# Patient Record
Sex: Female | Born: 1950 | Race: White | Hispanic: No | Marital: Single | Smoking: Current every day smoker
Health system: Southern US, Community
[De-identification: ages and names within clinical notes are randomized; demographics above are authoritative.]

## PROBLEM LIST (undated history)

## (undated) DIAGNOSIS — F419 Anxiety disorder, unspecified: Secondary | ICD-10-CM

## (undated) DIAGNOSIS — I10 Essential (primary) hypertension: Secondary | ICD-10-CM

## (undated) DIAGNOSIS — M5030 Other cervical disc degeneration, unspecified cervical region: Secondary | ICD-10-CM

## (undated) DIAGNOSIS — B159 Hepatitis A without hepatic coma: Secondary | ICD-10-CM

## (undated) HISTORY — PX: BLADDER SURGERY: SHX569

---

## 2014-04-23 DIAGNOSIS — M76899 Other specified enthesopathies of unspecified lower limb, excluding foot: Secondary | ICD-10-CM | POA: Diagnosis not present

## 2014-04-23 DIAGNOSIS — Z79899 Other long term (current) drug therapy: Secondary | ICD-10-CM | POA: Diagnosis not present

## 2014-04-23 DIAGNOSIS — Z131 Encounter for screening for diabetes mellitus: Secondary | ICD-10-CM | POA: Diagnosis not present

## 2014-04-23 DIAGNOSIS — I10 Essential (primary) hypertension: Secondary | ICD-10-CM | POA: Diagnosis not present

## 2014-07-23 DIAGNOSIS — I10 Essential (primary) hypertension: Secondary | ICD-10-CM | POA: Diagnosis not present

## 2014-10-22 DIAGNOSIS — M25561 Pain in right knee: Secondary | ICD-10-CM | POA: Diagnosis not present

## 2014-10-22 DIAGNOSIS — Z0000 Encounter for general adult medical examination without abnormal findings: Secondary | ICD-10-CM | POA: Diagnosis not present

## 2014-10-22 DIAGNOSIS — I10 Essential (primary) hypertension: Secondary | ICD-10-CM | POA: Diagnosis not present

## 2014-10-22 DIAGNOSIS — Z131 Encounter for screening for diabetes mellitus: Secondary | ICD-10-CM | POA: Diagnosis not present

## 2014-10-22 DIAGNOSIS — M25562 Pain in left knee: Secondary | ICD-10-CM | POA: Diagnosis not present

## 2014-11-06 DIAGNOSIS — K265 Chronic or unspecified duodenal ulcer with perforation: Secondary | ICD-10-CM | POA: Diagnosis not present

## 2014-11-06 DIAGNOSIS — J441 Chronic obstructive pulmonary disease with (acute) exacerbation: Secondary | ICD-10-CM | POA: Diagnosis present

## 2014-11-06 DIAGNOSIS — K668 Other specified disorders of peritoneum: Secondary | ICD-10-CM | POA: Diagnosis not present

## 2014-11-06 DIAGNOSIS — Z8249 Family history of ischemic heart disease and other diseases of the circulatory system: Secondary | ICD-10-CM | POA: Diagnosis not present

## 2014-11-06 DIAGNOSIS — Z2821 Immunization not carried out because of patient refusal: Secondary | ICD-10-CM | POA: Diagnosis not present

## 2014-11-06 DIAGNOSIS — N390 Urinary tract infection, site not specified: Secondary | ICD-10-CM | POA: Diagnosis not present

## 2014-11-06 DIAGNOSIS — M1711 Unilateral primary osteoarthritis, right knee: Secondary | ICD-10-CM | POA: Diagnosis not present

## 2014-11-06 DIAGNOSIS — R0602 Shortness of breath: Secondary | ICD-10-CM | POA: Diagnosis not present

## 2014-11-06 DIAGNOSIS — N179 Acute kidney failure, unspecified: Secondary | ICD-10-CM | POA: Diagnosis not present

## 2014-11-06 DIAGNOSIS — E876 Hypokalemia: Secondary | ICD-10-CM | POA: Diagnosis present

## 2014-11-06 DIAGNOSIS — K598 Other specified functional intestinal disorders: Secondary | ICD-10-CM | POA: Diagnosis not present

## 2014-11-06 DIAGNOSIS — J9811 Atelectasis: Secondary | ICD-10-CM | POA: Diagnosis not present

## 2014-11-06 DIAGNOSIS — F17200 Nicotine dependence, unspecified, uncomplicated: Secondary | ICD-10-CM | POA: Diagnosis present

## 2014-11-06 DIAGNOSIS — N2889 Other specified disorders of kidney and ureter: Secondary | ICD-10-CM | POA: Diagnosis present

## 2014-11-06 DIAGNOSIS — D270 Benign neoplasm of right ovary: Secondary | ICD-10-CM | POA: Diagnosis not present

## 2014-11-06 DIAGNOSIS — K560 Paralytic ileus: Secondary | ICD-10-CM | POA: Diagnosis not present

## 2014-11-06 DIAGNOSIS — A419 Sepsis, unspecified organism: Secondary | ICD-10-CM | POA: Diagnosis present

## 2014-11-06 DIAGNOSIS — I10 Essential (primary) hypertension: Secondary | ICD-10-CM | POA: Diagnosis present

## 2014-11-06 DIAGNOSIS — R918 Other nonspecific abnormal finding of lung field: Secondary | ICD-10-CM | POA: Diagnosis not present

## 2014-11-06 DIAGNOSIS — R6521 Severe sepsis with septic shock: Secondary | ICD-10-CM | POA: Diagnosis not present

## 2014-11-06 DIAGNOSIS — K631 Perforation of intestine (nontraumatic): Secondary | ICD-10-CM | POA: Diagnosis not present

## 2014-11-06 DIAGNOSIS — Z8261 Family history of arthritis: Secondary | ICD-10-CM | POA: Diagnosis not present

## 2014-11-06 DIAGNOSIS — M11261 Other chondrocalcinosis, right knee: Secondary | ICD-10-CM | POA: Diagnosis not present

## 2014-11-06 DIAGNOSIS — K219 Gastro-esophageal reflux disease without esophagitis: Secondary | ICD-10-CM | POA: Diagnosis present

## 2014-11-06 DIAGNOSIS — E279 Disorder of adrenal gland, unspecified: Secondary | ICD-10-CM | POA: Diagnosis present

## 2014-11-06 DIAGNOSIS — R1013 Epigastric pain: Secondary | ICD-10-CM | POA: Diagnosis not present

## 2014-11-06 DIAGNOSIS — Z780 Asymptomatic menopausal state: Secondary | ICD-10-CM | POA: Diagnosis not present

## 2014-11-06 DIAGNOSIS — Z880 Allergy status to penicillin: Secondary | ICD-10-CM | POA: Diagnosis not present

## 2014-11-06 DIAGNOSIS — Z79899 Other long term (current) drug therapy: Secondary | ICD-10-CM | POA: Diagnosis not present

## 2014-11-06 DIAGNOSIS — K6389 Other specified diseases of intestine: Secondary | ICD-10-CM | POA: Diagnosis not present

## 2014-11-06 DIAGNOSIS — Z888 Allergy status to other drugs, medicaments and biological substances status: Secondary | ICD-10-CM | POA: Diagnosis not present

## 2014-11-06 DIAGNOSIS — M25461 Effusion, right knee: Secondary | ICD-10-CM | POA: Diagnosis not present

## 2014-11-06 DIAGNOSIS — J449 Chronic obstructive pulmonary disease, unspecified: Secondary | ICD-10-CM | POA: Diagnosis not present

## 2014-11-06 DIAGNOSIS — M25561 Pain in right knee: Secondary | ICD-10-CM | POA: Diagnosis present

## 2014-11-13 DIAGNOSIS — A048 Other specified bacterial intestinal infections: Secondary | ICD-10-CM | POA: Diagnosis present

## 2014-11-13 DIAGNOSIS — J441 Chronic obstructive pulmonary disease with (acute) exacerbation: Secondary | ICD-10-CM | POA: Diagnosis not present

## 2014-11-13 DIAGNOSIS — R109 Unspecified abdominal pain: Secondary | ICD-10-CM | POA: Diagnosis not present

## 2014-11-13 DIAGNOSIS — N390 Urinary tract infection, site not specified: Secondary | ICD-10-CM | POA: Diagnosis not present

## 2014-11-13 DIAGNOSIS — L89151 Pressure ulcer of sacral region, stage 1: Secondary | ICD-10-CM | POA: Diagnosis present

## 2014-11-13 DIAGNOSIS — M25461 Effusion, right knee: Secondary | ICD-10-CM | POA: Diagnosis not present

## 2014-11-13 DIAGNOSIS — K265 Chronic or unspecified duodenal ulcer with perforation: Secondary | ICD-10-CM | POA: Diagnosis present

## 2014-11-13 DIAGNOSIS — J449 Chronic obstructive pulmonary disease, unspecified: Secondary | ICD-10-CM | POA: Diagnosis not present

## 2014-11-13 DIAGNOSIS — M1990 Unspecified osteoarthritis, unspecified site: Secondary | ICD-10-CM | POA: Diagnosis present

## 2014-11-13 DIAGNOSIS — I10 Essential (primary) hypertension: Secondary | ICD-10-CM | POA: Diagnosis not present

## 2014-11-13 DIAGNOSIS — K904 Malabsorption due to intolerance, not elsewhere classified: Secondary | ICD-10-CM | POA: Diagnosis not present

## 2014-11-13 DIAGNOSIS — R1013 Epigastric pain: Secondary | ICD-10-CM | POA: Diagnosis not present

## 2014-11-13 DIAGNOSIS — Z880 Allergy status to penicillin: Secondary | ICD-10-CM | POA: Diagnosis not present

## 2014-11-13 DIAGNOSIS — K255 Chronic or unspecified gastric ulcer with perforation: Secondary | ICD-10-CM | POA: Diagnosis present

## 2014-11-13 DIAGNOSIS — F419 Anxiety disorder, unspecified: Secondary | ICD-10-CM | POA: Diagnosis present

## 2014-11-13 DIAGNOSIS — R0602 Shortness of breath: Secondary | ICD-10-CM | POA: Diagnosis not present

## 2014-11-13 DIAGNOSIS — E279 Disorder of adrenal gland, unspecified: Secondary | ICD-10-CM | POA: Diagnosis present

## 2014-11-13 DIAGNOSIS — Z91048 Other nonmedicinal substance allergy status: Secondary | ICD-10-CM | POA: Diagnosis not present

## 2014-11-13 DIAGNOSIS — R1084 Generalized abdominal pain: Secondary | ICD-10-CM | POA: Diagnosis present

## 2014-11-13 DIAGNOSIS — B9681 Helicobacter pylori [H. pylori] as the cause of diseases classified elsewhere: Secondary | ICD-10-CM | POA: Diagnosis not present

## 2014-11-13 DIAGNOSIS — Z87891 Personal history of nicotine dependence: Secondary | ICD-10-CM | POA: Diagnosis not present

## 2014-11-13 DIAGNOSIS — N179 Acute kidney failure, unspecified: Secondary | ICD-10-CM | POA: Diagnosis not present

## 2014-11-13 DIAGNOSIS — K560 Paralytic ileus: Secondary | ICD-10-CM | POA: Diagnosis not present

## 2014-11-13 DIAGNOSIS — M25561 Pain in right knee: Secondary | ICD-10-CM | POA: Diagnosis not present

## 2014-11-13 DIAGNOSIS — K689 Other disorders of retroperitoneum: Secondary | ICD-10-CM | POA: Diagnosis not present

## 2014-11-20 DIAGNOSIS — J449 Chronic obstructive pulmonary disease, unspecified: Secondary | ICD-10-CM | POA: Diagnosis not present

## 2014-11-20 DIAGNOSIS — R2681 Unsteadiness on feet: Secondary | ICD-10-CM | POA: Diagnosis not present

## 2014-11-20 DIAGNOSIS — I10 Essential (primary) hypertension: Secondary | ICD-10-CM | POA: Diagnosis not present

## 2014-11-20 DIAGNOSIS — K251 Acute gastric ulcer with perforation: Secondary | ICD-10-CM | POA: Diagnosis not present

## 2014-11-20 DIAGNOSIS — Z720 Tobacco use: Secondary | ICD-10-CM | POA: Diagnosis not present

## 2014-11-20 DIAGNOSIS — F419 Anxiety disorder, unspecified: Secondary | ICD-10-CM | POA: Diagnosis not present

## 2014-11-20 DIAGNOSIS — R1909 Other intra-abdominal and pelvic swelling, mass and lump: Secondary | ICD-10-CM | POA: Diagnosis not present

## 2014-11-22 DIAGNOSIS — R2681 Unsteadiness on feet: Secondary | ICD-10-CM | POA: Diagnosis not present

## 2014-11-22 DIAGNOSIS — F419 Anxiety disorder, unspecified: Secondary | ICD-10-CM | POA: Diagnosis not present

## 2014-11-22 DIAGNOSIS — R1909 Other intra-abdominal and pelvic swelling, mass and lump: Secondary | ICD-10-CM | POA: Diagnosis not present

## 2014-11-22 DIAGNOSIS — K251 Acute gastric ulcer with perforation: Secondary | ICD-10-CM | POA: Diagnosis not present

## 2014-11-22 DIAGNOSIS — J449 Chronic obstructive pulmonary disease, unspecified: Secondary | ICD-10-CM | POA: Diagnosis not present

## 2014-11-22 DIAGNOSIS — I10 Essential (primary) hypertension: Secondary | ICD-10-CM | POA: Diagnosis not present

## 2014-11-23 DIAGNOSIS — Z79899 Other long term (current) drug therapy: Secondary | ICD-10-CM | POA: Diagnosis not present

## 2014-11-23 DIAGNOSIS — N839 Noninflammatory disorder of ovary, fallopian tube and broad ligament, unspecified: Secondary | ICD-10-CM | POA: Diagnosis not present

## 2014-11-23 DIAGNOSIS — Z1151 Encounter for screening for human papillomavirus (HPV): Secondary | ICD-10-CM | POA: Diagnosis not present

## 2014-11-23 DIAGNOSIS — Z880 Allergy status to penicillin: Secondary | ICD-10-CM | POA: Diagnosis not present

## 2014-11-23 DIAGNOSIS — Z124 Encounter for screening for malignant neoplasm of cervix: Secondary | ICD-10-CM | POA: Diagnosis not present

## 2014-11-23 DIAGNOSIS — R1900 Intra-abdominal and pelvic swelling, mass and lump, unspecified site: Secondary | ICD-10-CM | POA: Diagnosis not present

## 2014-11-23 DIAGNOSIS — J449 Chronic obstructive pulmonary disease, unspecified: Secondary | ICD-10-CM | POA: Diagnosis not present

## 2014-11-23 DIAGNOSIS — N8320 Unspecified ovarian cysts: Secondary | ICD-10-CM | POA: Diagnosis not present

## 2014-11-23 DIAGNOSIS — Z79891 Long term (current) use of opiate analgesic: Secondary | ICD-10-CM | POA: Diagnosis not present

## 2014-11-23 DIAGNOSIS — I10 Essential (primary) hypertension: Secondary | ICD-10-CM | POA: Diagnosis not present

## 2014-11-23 DIAGNOSIS — Z91048 Other nonmedicinal substance allergy status: Secondary | ICD-10-CM | POA: Diagnosis not present

## 2014-11-23 DIAGNOSIS — N838 Other noninflammatory disorders of ovary, fallopian tube and broad ligament: Secondary | ICD-10-CM | POA: Diagnosis not present

## 2014-11-23 DIAGNOSIS — R21 Rash and other nonspecific skin eruption: Secondary | ICD-10-CM | POA: Diagnosis not present

## 2014-11-23 DIAGNOSIS — Z87891 Personal history of nicotine dependence: Secondary | ICD-10-CM | POA: Diagnosis not present

## 2014-11-25 DIAGNOSIS — Z880 Allergy status to penicillin: Secondary | ICD-10-CM | POA: Diagnosis not present

## 2014-11-25 DIAGNOSIS — S3991XA Unspecified injury of abdomen, initial encounter: Secondary | ICD-10-CM | POA: Diagnosis not present

## 2014-11-25 DIAGNOSIS — J449 Chronic obstructive pulmonary disease, unspecified: Secondary | ICD-10-CM | POA: Diagnosis not present

## 2014-11-25 DIAGNOSIS — D270 Benign neoplasm of right ovary: Secondary | ICD-10-CM | POA: Diagnosis not present

## 2014-11-25 DIAGNOSIS — I10 Essential (primary) hypertension: Secondary | ICD-10-CM | POA: Diagnosis not present

## 2014-11-25 DIAGNOSIS — I5031 Acute diastolic (congestive) heart failure: Secondary | ICD-10-CM | POA: Diagnosis not present

## 2014-11-25 DIAGNOSIS — L03114 Cellulitis of left upper limb: Secondary | ICD-10-CM | POA: Diagnosis not present

## 2014-11-25 DIAGNOSIS — W19XXXA Unspecified fall, initial encounter: Secondary | ICD-10-CM | POA: Diagnosis not present

## 2014-11-25 DIAGNOSIS — T796XXA Traumatic ischemia of muscle, initial encounter: Secondary | ICD-10-CM | POA: Diagnosis not present

## 2014-11-27 DIAGNOSIS — J449 Chronic obstructive pulmonary disease, unspecified: Secondary | ICD-10-CM | POA: Diagnosis present

## 2014-11-27 DIAGNOSIS — F419 Anxiety disorder, unspecified: Secondary | ICD-10-CM | POA: Diagnosis not present

## 2014-11-27 DIAGNOSIS — S4992XA Unspecified injury of left shoulder and upper arm, initial encounter: Secondary | ICD-10-CM | POA: Diagnosis not present

## 2014-11-27 DIAGNOSIS — R609 Edema, unspecified: Secondary | ICD-10-CM | POA: Diagnosis not present

## 2014-11-27 DIAGNOSIS — Z8249 Family history of ischemic heart disease and other diseases of the circulatory system: Secondary | ICD-10-CM | POA: Diagnosis not present

## 2014-11-27 DIAGNOSIS — E278 Other specified disorders of adrenal gland: Secondary | ICD-10-CM | POA: Diagnosis present

## 2014-11-27 DIAGNOSIS — T796XXD Traumatic ischemia of muscle, subsequent encounter: Secondary | ICD-10-CM | POA: Diagnosis not present

## 2014-11-27 DIAGNOSIS — M79602 Pain in left arm: Secondary | ICD-10-CM | POA: Diagnosis not present

## 2014-11-27 DIAGNOSIS — Z880 Allergy status to penicillin: Secondary | ICD-10-CM | POA: Diagnosis not present

## 2014-11-27 DIAGNOSIS — Z79899 Other long term (current) drug therapy: Secondary | ICD-10-CM | POA: Diagnosis not present

## 2014-11-27 DIAGNOSIS — K219 Gastro-esophageal reflux disease without esophagitis: Secondary | ICD-10-CM | POA: Diagnosis not present

## 2014-11-27 DIAGNOSIS — I5031 Acute diastolic (congestive) heart failure: Secondary | ICD-10-CM | POA: Diagnosis not present

## 2014-11-27 DIAGNOSIS — Z9181 History of falling: Secondary | ICD-10-CM | POA: Diagnosis not present

## 2014-11-27 DIAGNOSIS — Z8261 Family history of arthritis: Secondary | ICD-10-CM | POA: Diagnosis not present

## 2014-11-27 DIAGNOSIS — L03119 Cellulitis of unspecified part of limb: Secondary | ICD-10-CM | POA: Diagnosis not present

## 2014-11-27 DIAGNOSIS — E279 Disorder of adrenal gland, unspecified: Secondary | ICD-10-CM | POA: Diagnosis not present

## 2014-11-27 DIAGNOSIS — M6281 Muscle weakness (generalized): Secondary | ICD-10-CM | POA: Diagnosis not present

## 2014-11-27 DIAGNOSIS — R262 Difficulty in walking, not elsewhere classified: Secondary | ICD-10-CM | POA: Diagnosis not present

## 2014-11-27 DIAGNOSIS — L03114 Cellulitis of left upper limb: Secondary | ICD-10-CM | POA: Diagnosis not present

## 2014-11-27 DIAGNOSIS — N8320 Unspecified ovarian cysts: Secondary | ICD-10-CM | POA: Diagnosis not present

## 2014-11-27 DIAGNOSIS — T796XXA Traumatic ischemia of muscle, initial encounter: Secondary | ICD-10-CM | POA: Diagnosis not present

## 2014-11-27 DIAGNOSIS — M7989 Other specified soft tissue disorders: Secondary | ICD-10-CM | POA: Diagnosis not present

## 2014-11-27 DIAGNOSIS — I10 Essential (primary) hypertension: Secondary | ICD-10-CM | POA: Diagnosis not present

## 2014-11-27 DIAGNOSIS — D270 Benign neoplasm of right ovary: Secondary | ICD-10-CM | POA: Diagnosis present

## 2014-11-27 DIAGNOSIS — W1830XA Fall on same level, unspecified, initial encounter: Secondary | ICD-10-CM | POA: Diagnosis not present

## 2014-11-30 DIAGNOSIS — L03114 Cellulitis of left upper limb: Secondary | ICD-10-CM | POA: Diagnosis not present

## 2014-11-30 DIAGNOSIS — M7989 Other specified soft tissue disorders: Secondary | ICD-10-CM | POA: Diagnosis not present

## 2014-11-30 DIAGNOSIS — W1830XA Fall on same level, unspecified, initial encounter: Secondary | ICD-10-CM | POA: Diagnosis not present

## 2014-11-30 DIAGNOSIS — J449 Chronic obstructive pulmonary disease, unspecified: Secondary | ICD-10-CM | POA: Diagnosis not present

## 2014-11-30 DIAGNOSIS — F411 Generalized anxiety disorder: Secondary | ICD-10-CM | POA: Diagnosis not present

## 2014-11-30 DIAGNOSIS — R531 Weakness: Secondary | ICD-10-CM | POA: Diagnosis not present

## 2014-11-30 DIAGNOSIS — I5030 Unspecified diastolic (congestive) heart failure: Secondary | ICD-10-CM | POA: Diagnosis not present

## 2014-11-30 DIAGNOSIS — Z880 Allergy status to penicillin: Secondary | ICD-10-CM | POA: Diagnosis not present

## 2014-11-30 DIAGNOSIS — M6281 Muscle weakness (generalized): Secondary | ICD-10-CM | POA: Diagnosis not present

## 2014-11-30 DIAGNOSIS — L03119 Cellulitis of unspecified part of limb: Secondary | ICD-10-CM | POA: Diagnosis not present

## 2014-11-30 DIAGNOSIS — R296 Repeated falls: Secondary | ICD-10-CM | POA: Diagnosis not present

## 2014-11-30 DIAGNOSIS — S91001A Unspecified open wound, right ankle, initial encounter: Secondary | ICD-10-CM | POA: Diagnosis not present

## 2014-11-30 DIAGNOSIS — R609 Edema, unspecified: Secondary | ICD-10-CM | POA: Diagnosis not present

## 2014-11-30 DIAGNOSIS — Z9181 History of falling: Secondary | ICD-10-CM | POA: Diagnosis not present

## 2014-11-30 DIAGNOSIS — T796XXD Traumatic ischemia of muscle, subsequent encounter: Secondary | ICD-10-CM | POA: Diagnosis not present

## 2014-11-30 DIAGNOSIS — Z23 Encounter for immunization: Secondary | ICD-10-CM | POA: Diagnosis not present

## 2014-11-30 DIAGNOSIS — R600 Localized edema: Secondary | ICD-10-CM | POA: Diagnosis not present

## 2014-11-30 DIAGNOSIS — R262 Difficulty in walking, not elsewhere classified: Secondary | ICD-10-CM | POA: Diagnosis not present

## 2014-11-30 DIAGNOSIS — I10 Essential (primary) hypertension: Secondary | ICD-10-CM | POA: Diagnosis not present

## 2014-11-30 DIAGNOSIS — T796XXA Traumatic ischemia of muscle, initial encounter: Secondary | ICD-10-CM | POA: Diagnosis not present

## 2014-11-30 DIAGNOSIS — K219 Gastro-esophageal reflux disease without esophagitis: Secondary | ICD-10-CM | POA: Diagnosis not present

## 2014-11-30 DIAGNOSIS — M79602 Pain in left arm: Secondary | ICD-10-CM | POA: Diagnosis not present

## 2014-11-30 DIAGNOSIS — E279 Disorder of adrenal gland, unspecified: Secondary | ICD-10-CM | POA: Diagnosis not present

## 2014-11-30 DIAGNOSIS — F419 Anxiety disorder, unspecified: Secondary | ICD-10-CM | POA: Diagnosis not present

## 2014-11-30 DIAGNOSIS — L03115 Cellulitis of right lower limb: Secondary | ICD-10-CM | POA: Diagnosis not present

## 2014-11-30 DIAGNOSIS — D72829 Elevated white blood cell count, unspecified: Secondary | ICD-10-CM | POA: Diagnosis not present

## 2014-11-30 DIAGNOSIS — I5031 Acute diastolic (congestive) heart failure: Secondary | ICD-10-CM | POA: Diagnosis not present

## 2014-11-30 DIAGNOSIS — S4992XA Unspecified injury of left shoulder and upper arm, initial encounter: Secondary | ICD-10-CM | POA: Diagnosis not present

## 2014-11-30 DIAGNOSIS — N8320 Unspecified ovarian cysts: Secondary | ICD-10-CM | POA: Diagnosis not present

## 2014-12-04 DIAGNOSIS — I5030 Unspecified diastolic (congestive) heart failure: Secondary | ICD-10-CM | POA: Diagnosis not present

## 2014-12-04 DIAGNOSIS — T796XXA Traumatic ischemia of muscle, initial encounter: Secondary | ICD-10-CM | POA: Diagnosis not present

## 2014-12-04 DIAGNOSIS — J449 Chronic obstructive pulmonary disease, unspecified: Secondary | ICD-10-CM | POA: Diagnosis not present

## 2014-12-04 DIAGNOSIS — F411 Generalized anxiety disorder: Secondary | ICD-10-CM | POA: Diagnosis not present

## 2014-12-05 DIAGNOSIS — R531 Weakness: Secondary | ICD-10-CM | POA: Diagnosis not present

## 2014-12-05 DIAGNOSIS — T796XXA Traumatic ischemia of muscle, initial encounter: Secondary | ICD-10-CM | POA: Diagnosis not present

## 2014-12-05 DIAGNOSIS — I5030 Unspecified diastolic (congestive) heart failure: Secondary | ICD-10-CM | POA: Diagnosis not present

## 2014-12-05 DIAGNOSIS — R296 Repeated falls: Secondary | ICD-10-CM | POA: Diagnosis not present

## 2014-12-07 DIAGNOSIS — R531 Weakness: Secondary | ICD-10-CM | POA: Diagnosis not present

## 2014-12-07 DIAGNOSIS — T796XXA Traumatic ischemia of muscle, initial encounter: Secondary | ICD-10-CM | POA: Diagnosis not present

## 2014-12-07 DIAGNOSIS — J449 Chronic obstructive pulmonary disease, unspecified: Secondary | ICD-10-CM | POA: Diagnosis not present

## 2014-12-07 DIAGNOSIS — I5030 Unspecified diastolic (congestive) heart failure: Secondary | ICD-10-CM | POA: Diagnosis not present

## 2014-12-11 DIAGNOSIS — D72829 Elevated white blood cell count, unspecified: Secondary | ICD-10-CM | POA: Diagnosis not present

## 2014-12-11 DIAGNOSIS — R531 Weakness: Secondary | ICD-10-CM | POA: Diagnosis not present

## 2014-12-11 DIAGNOSIS — L03115 Cellulitis of right lower limb: Secondary | ICD-10-CM | POA: Diagnosis not present

## 2014-12-11 DIAGNOSIS — S91001A Unspecified open wound, right ankle, initial encounter: Secondary | ICD-10-CM | POA: Diagnosis not present

## 2014-12-14 DIAGNOSIS — R600 Localized edema: Secondary | ICD-10-CM | POA: Diagnosis not present

## 2014-12-14 DIAGNOSIS — I5030 Unspecified diastolic (congestive) heart failure: Secondary | ICD-10-CM | POA: Diagnosis not present

## 2015-01-21 DIAGNOSIS — R1010 Upper abdominal pain, unspecified: Secondary | ICD-10-CM | POA: Diagnosis not present

## 2015-01-21 DIAGNOSIS — J440 Chronic obstructive pulmonary disease with acute lower respiratory infection: Secondary | ICD-10-CM | POA: Diagnosis not present

## 2015-01-21 DIAGNOSIS — I10 Essential (primary) hypertension: Secondary | ICD-10-CM | POA: Diagnosis not present

## 2015-04-23 DIAGNOSIS — J449 Chronic obstructive pulmonary disease, unspecified: Secondary | ICD-10-CM | POA: Diagnosis not present

## 2015-04-23 DIAGNOSIS — I10 Essential (primary) hypertension: Secondary | ICD-10-CM | POA: Diagnosis not present

## 2015-07-19 DIAGNOSIS — I10 Essential (primary) hypertension: Secondary | ICD-10-CM | POA: Diagnosis not present

## 2015-07-19 DIAGNOSIS — J441 Chronic obstructive pulmonary disease with (acute) exacerbation: Secondary | ICD-10-CM | POA: Diagnosis not present

## 2016-01-26 ENCOUNTER — Inpatient Hospital Stay (HOSPITAL_COMMUNITY)
Admission: AD | Admit: 2016-01-26 | Discharge: 2016-01-31 | DRG: 481 | Disposition: A | Discharge status: Skilled Nursing Facility | Payer: Medicare HMO | Source: Other Acute Inpatient Hospital | Attending: Internal Medicine | Admitting: Internal Medicine

## 2016-01-26 ENCOUNTER — Encounter (HOSPITAL_COMMUNITY): Payer: Self-pay | Admitting: Internal Medicine

## 2016-01-26 DIAGNOSIS — Z419 Encounter for procedure for purposes other than remedying health state, unspecified: Secondary | ICD-10-CM

## 2016-01-26 DIAGNOSIS — F17200 Nicotine dependence, unspecified, uncomplicated: Secondary | ICD-10-CM | POA: Diagnosis present

## 2016-01-26 DIAGNOSIS — Z681 Body mass index (BMI) 19 or less, adult: Secondary | ICD-10-CM | POA: Diagnosis not present

## 2016-01-26 DIAGNOSIS — Z9181 History of falling: Secondary | ICD-10-CM | POA: Diagnosis not present

## 2016-01-26 DIAGNOSIS — Z87891 Personal history of nicotine dependence: Secondary | ICD-10-CM | POA: Diagnosis not present

## 2016-01-26 DIAGNOSIS — E042 Nontoxic multinodular goiter: Secondary | ICD-10-CM | POA: Diagnosis present

## 2016-01-26 DIAGNOSIS — D62 Acute posthemorrhagic anemia: Secondary | ICD-10-CM | POA: Diagnosis not present

## 2016-01-26 DIAGNOSIS — S72002A Fracture of unspecified part of neck of left femur, initial encounter for closed fracture: Secondary | ICD-10-CM | POA: Diagnosis not present

## 2016-01-26 DIAGNOSIS — I10 Essential (primary) hypertension: Secondary | ICD-10-CM | POA: Diagnosis present

## 2016-01-26 DIAGNOSIS — G9389 Other specified disorders of brain: Secondary | ICD-10-CM | POA: Diagnosis not present

## 2016-01-26 DIAGNOSIS — S72002S Fracture of unspecified part of neck of left femur, sequela: Secondary | ICD-10-CM | POA: Diagnosis not present

## 2016-01-26 DIAGNOSIS — M5030 Other cervical disc degeneration, unspecified cervical region: Secondary | ICD-10-CM | POA: Diagnosis present

## 2016-01-26 DIAGNOSIS — Z4789 Encounter for other orthopedic aftercare: Secondary | ICD-10-CM | POA: Diagnosis not present

## 2016-01-26 DIAGNOSIS — E440 Moderate protein-calorie malnutrition: Secondary | ICD-10-CM | POA: Diagnosis present

## 2016-01-26 DIAGNOSIS — S72142A Displaced intertrochanteric fracture of left femur, initial encounter for closed fracture: Secondary | ICD-10-CM | POA: Diagnosis not present

## 2016-01-26 DIAGNOSIS — S199XXA Unspecified injury of neck, initial encounter: Secondary | ICD-10-CM | POA: Diagnosis not present

## 2016-01-26 DIAGNOSIS — T148XXA Other injury of unspecified body region, initial encounter: Secondary | ICD-10-CM

## 2016-01-26 DIAGNOSIS — S299XXA Unspecified injury of thorax, initial encounter: Secondary | ICD-10-CM | POA: Diagnosis not present

## 2016-01-26 DIAGNOSIS — S72009A Fracture of unspecified part of neck of unspecified femur, initial encounter for closed fracture: Secondary | ICD-10-CM

## 2016-01-26 DIAGNOSIS — W172XXA Fall into hole, initial encounter: Secondary | ICD-10-CM | POA: Diagnosis not present

## 2016-01-26 DIAGNOSIS — R262 Difficulty in walking, not elsewhere classified: Secondary | ICD-10-CM | POA: Diagnosis not present

## 2016-01-26 DIAGNOSIS — M25552 Pain in left hip: Secondary | ICD-10-CM | POA: Diagnosis present

## 2016-01-26 DIAGNOSIS — S4991XA Unspecified injury of right shoulder and upper arm, initial encounter: Secondary | ICD-10-CM | POA: Diagnosis not present

## 2016-01-26 DIAGNOSIS — R279 Unspecified lack of coordination: Secondary | ICD-10-CM | POA: Diagnosis not present

## 2016-01-26 DIAGNOSIS — S0990XA Unspecified injury of head, initial encounter: Secondary | ICD-10-CM | POA: Diagnosis not present

## 2016-01-26 DIAGNOSIS — M25511 Pain in right shoulder: Secondary | ICD-10-CM | POA: Diagnosis not present

## 2016-01-26 DIAGNOSIS — S72145D Nondisplaced intertrochanteric fracture of left femur, subsequent encounter for closed fracture with routine healing: Secondary | ICD-10-CM | POA: Diagnosis not present

## 2016-01-26 DIAGNOSIS — R339 Retention of urine, unspecified: Secondary | ICD-10-CM | POA: Diagnosis not present

## 2016-01-26 DIAGNOSIS — R52 Pain, unspecified: Secondary | ICD-10-CM

## 2016-01-26 DIAGNOSIS — D649 Anemia, unspecified: Secondary | ICD-10-CM | POA: Diagnosis not present

## 2016-01-26 DIAGNOSIS — F411 Generalized anxiety disorder: Secondary | ICD-10-CM | POA: Diagnosis present

## 2016-01-26 HISTORY — DX: Anxiety disorder, unspecified: F41.9

## 2016-01-26 HISTORY — DX: Essential (primary) hypertension: I10

## 2016-01-26 HISTORY — DX: Hepatitis a without hepatic coma: B15.9

## 2016-01-26 HISTORY — DX: Other cervical disc degeneration, unspecified cervical region: M50.30

## 2016-01-26 LAB — CBC
HEMATOCRIT: 26.5 % — AB (ref 36.0–46.0)
HEMOGLOBIN: 8.8 g/dL — AB (ref 12.0–15.0)
MCH: 30.8 pg (ref 26.0–34.0)
MCHC: 33.2 g/dL (ref 30.0–36.0)
MCV: 92.7 fL (ref 78.0–100.0)
Platelets: 263 10*3/uL (ref 150–400)
RBC: 2.86 MIL/uL — ABNORMAL LOW (ref 3.87–5.11)
RDW: 14.1 % (ref 11.5–15.5)
WBC: 9.7 10*3/uL (ref 4.0–10.5)

## 2016-01-26 LAB — CREATININE, SERUM
Creatinine, Ser: 1.29 mg/dL — ABNORMAL HIGH (ref 0.44–1.00)
GFR, EST AFRICAN AMERICAN: 49 mL/min — AB (ref >60)
GFR, EST NON AFRICAN AMERICAN: 42 mL/min — AB (ref >60)

## 2016-01-26 LAB — TSH: TSH: 0.768 u[IU]/mL (ref 0.350–4.500)

## 2016-01-26 MED ORDER — SODIUM CHLORIDE 0.9 % IV SOLN
INTRAVENOUS | Status: DC
Start: 2016-01-26 — End: 2016-01-28
  Administered 2016-01-26 – 2016-01-28 (×4): via INTRAVENOUS

## 2016-01-26 MED ORDER — ALPRAZOLAM 0.5 MG PO TABS
1.0000 mg | ORAL_TABLET | Freq: Three times a day (TID) | ORAL | Status: DC | PRN
  Administered 2016-01-26 – 2016-01-30 (×7): 1 mg via ORAL
  Filled 2016-01-26 (×8): qty 2

## 2016-01-26 MED ORDER — BISACODYL 10 MG RE SUPP: 10.0000 mg | Freq: Every day | RECTAL | Status: DC | PRN

## 2016-01-26 MED ORDER — METHOCARBAMOL 500 MG PO TABS
500.0000 mg | ORAL_TABLET | Freq: Four times a day (QID) | ORAL | Status: DC | PRN
  Administered 2016-01-26 – 2016-01-27 (×2): 500 mg via ORAL
  Filled 2016-01-26 (×2): qty 1

## 2016-01-26 MED ORDER — HYDROCODONE-ACETAMINOPHEN 5-325 MG PO TABS
1.0000 | ORAL_TABLET | Freq: Four times a day (QID) | ORAL | Status: DC | PRN
  Administered 2016-01-26: 1 via ORAL
  Administered 2016-01-27 – 2016-01-31 (×12): 2 via ORAL
  Filled 2016-01-26 (×9): qty 2
  Filled 2016-01-26: qty 1
  Filled 2016-01-26 (×3): qty 2

## 2016-01-26 MED ORDER — MORPHINE SULFATE (PF) 2 MG/ML IV SOLN
0.5000 mg | INTRAVENOUS | Status: DC | PRN
  Administered 2016-01-26 – 2016-01-27 (×6): 0.5 mg via INTRAVENOUS
  Filled 2016-01-26 (×6): qty 1

## 2016-01-26 MED ORDER — LORAZEPAM 1 MG PO TABS: 1.0000 mg | ORAL_TABLET | Freq: Three times a day (TID) | ORAL | Status: DC | PRN

## 2016-01-26 MED ORDER — METHOCARBAMOL 1000 MG/10ML IJ SOLN: 500.0000 mg | Freq: Four times a day (QID) | INTRAVENOUS | Status: DC | PRN

## 2016-01-26 MED ORDER — ENOXAPARIN SODIUM 40 MG/0.4ML ~~LOC~~ SOLN
40.0000 mg | SUBCUTANEOUS | Status: DC
  Administered 2016-01-27 – 2016-01-30 (×4): 40 mg via SUBCUTANEOUS
  Filled 2016-01-26 (×4): qty 0.4

## 2016-01-26 MED ORDER — POLYETHYLENE GLYCOL 3350 17 G PO PACK: 17.0000 g | PACK | Freq: Every day | ORAL | Status: DC | PRN

## 2016-01-26 MED ORDER — HYDRALAZINE HCL 20 MG/ML IJ SOLN: 10.0000 mg | Freq: Four times a day (QID) | INTRAMUSCULAR | Status: DC | PRN

## 2016-01-26 MED ORDER — DOCUSATE SODIUM 100 MG PO CAPS
100.0000 mg | ORAL_CAPSULE | Freq: Two times a day (BID) | ORAL | Status: DC
  Administered 2016-01-26 – 2016-01-30 (×9): 100 mg via ORAL
  Filled 2016-01-26 (×9): qty 1

## 2016-01-26 NOTE — H&P (Addendum)
History and Physical:    Nicole Soto   [id number] DOB: Oct 31, 1950 DOA: 01/26/2016  Referring MD/provider: Dr. Jacques Earthly PCP: Neale Burly, MD   Patient coming from: [facility] / home.  Chief Complaint: Left hip pain s/p fall   History of Present Illness:   Nicole Soto is an 65 y.o. female with a PMH of stable HTN managed with HCTZ 12.5 mg daily and Benazepril 20 mg daily and anxiety managed with Xanax 1mg  TID who tells me she went outside last night at 2 a.m. to feed her dogs, and slipped while walking on a wet, grassy hill.  She says she fell into a hole that came up to her shoulder and she was unable to get out on her own.  She denies any LOC or head injury.  She had terrible left hip pain and right shoulder pain after the fall.  She was found by a neighbor 5 hours later. The neighbor called the ambulance, and the patient was brought to [facility] for further evaluation where she was found to have a left proximal comminuted intertrochanteric femur fracture. Her left hip pain has eased off with IV pain medications, but still rates it as an "eight and a half".  Movement makes the pain worse, lying still eases it off.  Labs done at [facility] reviewed and are notable for a WBC of 16.5, creatinine 0.94, hemoglobin 9.8 and CK 2620.  UDS/alcohol level negative. The patient smokes 3 1/2 to 5 cigarettes a day.  She lives alone with 2 dogs. The patient has never been married, and is estranged from her daughter.    ED Course: Given IV morphine for pain control, arranged for transfer to [facility] for orthopedic consultation with Dr. [name].  ROS:   Review of Systems  Constitutional: Negative for fever and chills.       Was cold earlier after lying outside for 5 hours.  HENT: Negative.   Eyes: Negative.   Respiratory: Negative.   Cardiovascular: Positive for palpitations. Negative for chest pain.  Gastrointestinal: Negative.   Genitourinary: Negative.   Musculoskeletal:  Positive for joint pain and falls. Negative for myalgias.       Right foot, calf and shoulder pain s/p fall.  Neurological: Negative.   Endo/Heme/Allergies: Negative.   Psychiatric/Behavioral: The patient is nervous/anxious.    All other systems were reviewed are are negative.  Past Medical History:   Past Medical History  Diagnosis Date  . Hypertension   . Anxiety   . Hepatitis A   . DDD (degenerative disc disease), cervical     Past Surgical History:   Past Surgical History  Procedure Laterality Date  . Bladder surgery      Social History:   Social History   Social History  . Marital Status: Single    Spouse Name: N/A  . Number of Children: 1  . Years of Education: N/A   Occupational History  . Retired    Social History Main Topics  . Smoking status: Current Every Day Smoker  . Smokeless tobacco: Not on file  . Alcohol Use: 0.0 oz/week    0 Standard drinks or equivalent per week     Comment: Very little  . Drug Use: No  . Sexual Activity: Not on file   Other Topics Concern  . Not on file   Social History Narrative   he patient smokes 3 1/2 to 5 cigarettes a day.  She lives alone with 2 dogs. The patient  has never been married, and is estranged from her daughter.      Allergies   Penicillins and Tape  Family history:   Family History  Problem Relation Age of Onset  . Heart attack Mother   . Heart attack Brother     Current Medications:   Prior to Admission medications   Not on File    Physical Exam:   Filed Vitals:   01/26/16 1704  BP: 139/101  Pulse: 111  Temp: 98.8 F (37.1 C)  TempSrc: Oral  Resp: 20  Height: 5\' 4"  (1.626 m)  Weight: 50.803 kg (112 lb)  SpO2: 99%     Physical Exam: Blood pressure 139/101, pulse 111, temperature 98.8 F (37.1 C), temperature source Oral, resp. rate 20, height 5\' 4"  (1.626 m), weight 50.803 kg (112 lb), SpO2 99 %. Gen: No acute distress. Head: Normocephalic, atraumatic. Eyes: Pupils equal,  round and reactive to light. Extraocular movements intact.  Sclerae nonicteric. Mouth: Oropharynx reveals dry mucous membranes and poor dentition. Neck: Supple, no thyromegaly, no lymphadenopathy, no jugular venous distention. Chest: Lungs are clear to auscultation with good air movement. No rales, rhonchi or wheezes.  CV: Heart sounds are regular with an S1, S2. No murmurs, rubs, clicks, or gallops.  Abdomen: Soft, nontender, nondistended with normal active bowel sounds. Extremities: Left leg shortened and internally rotated. Pedal pulses 2+.  Skin: Abrasions upper extremities. Neuro: Alert and oriented times 2; grossly nonfocal.  Psych: Insight is good and judgment is mildly decreased. Mood and affect normal.   Data Review:    Labs: Reviewed. in physical chart.   Radiographic Studies: Reviewed, in physical chart.  EKG: Independently reviewed. NSR with PVCs.   Assessment/Plan:   Principal Problem:   Intertrochanteric fracture of left hip (HCC) - ASA pre-op protocal ordered per hip fracture order set. - Activity: Bedrest with HOB elevated 30 degrees. - Hip films show: Left comminuted intertrochanteric fracture. - Orthopedic surgery consulted. Dr. [name] to see. - CXR shows: No acute cardiopulmonary disease. - EKG shows: NSR with PVCs. - Pre-op clearance performed. Proceed with surgery. - SW consult for SNF placement.  - PT evaluation post operatively. - Vicodin / Morphine for pain control. - Continuous pulse oximetry x 24 hours then Q 4. - Robaxin PRN for muscle relaxation. - Bowel regimen initiated.  Active Problems:   Essential hypertension Hold lisinopril and HCTZ for now.    Generalized anxiety disorder Xanax ordered as needed at home dose.    Multiple thyroid nodules Will need thyroid ultrasound at some point to characterize further.  Check TSH.    Encephalomalacia on imaging study ? Prior stroke.     Other information:   DVT prophylaxis: Lovenox  ordered. Code Status: Full code. Family Communication: No family present.  Lists Heide Scales, her neighbor, as her emergency contact.  He can be reached at [phone]. Disposition Plan: Likely will need SNF. Consults called: Dr. Alain Marion, Orthopedic Surgery Admission status: Inpatient   Time spent: 1 hour.  Maximillian Habibi [facility] Pager [phone] Cell: [phone]   If 7PM-7AM, please contact night-coverage [email or web address] Password [facility] 01/26/2016, 5:57 PM

## 2016-01-27 ENCOUNTER — Encounter (HOSPITAL_COMMUNITY): Payer: Self-pay | Admitting: Anesthesiology

## 2016-01-27 ENCOUNTER — Encounter (HOSPITAL_COMMUNITY)
Admission: AD | Disposition: A | Discharge status: Skilled Nursing Facility | Payer: Self-pay | Source: Other Acute Inpatient Hospital | Attending: Internal Medicine

## 2016-01-27 ENCOUNTER — Inpatient Hospital Stay (HOSPITAL_COMMUNITY): Payer: Medicare HMO

## 2016-01-27 ENCOUNTER — Other Ambulatory Visit: Payer: Self-pay

## 2016-01-27 ENCOUNTER — Inpatient Hospital Stay (HOSPITAL_COMMUNITY): Payer: Medicare HMO | Admitting: Anesthesiology

## 2016-01-27 DIAGNOSIS — E440 Moderate protein-calorie malnutrition: Secondary | ICD-10-CM | POA: Insufficient documentation

## 2016-01-27 HISTORY — PX: FEMUR IM NAIL: SHX1597

## 2016-01-27 LAB — SURGICAL PCR SCREEN
MRSA, PCR: NEGATIVE
Staphylococcus aureus: NEGATIVE

## 2016-01-27 LAB — VITAMIN D 25 HYDROXY (VIT D DEFICIENCY, FRACTURES): Vit D, 25-Hydroxy: 32.2 ng/mL (ref 30.0–100.0)

## 2016-01-27 LAB — PREPARE RBC (CROSSMATCH)

## 2016-01-27 LAB — ABO/RH: ABO/RH(D): O POS

## 2016-01-27 SURGERY — INSERTION, INTRAMEDULLARY ROD, FEMUR
Anesthesia: General | Laterality: Left

## 2016-01-27 MED ORDER — SODIUM CHLORIDE 0.9 % IV SOLN: Freq: Once | INTRAVENOUS | Status: DC

## 2016-01-27 MED ORDER — METOCLOPRAMIDE HCL 5 MG/ML IJ SOLN
5.0000 mg | Freq: Three times a day (TID) | INTRAMUSCULAR | Status: DC | PRN
Start: 2016-01-27 — End: 2016-01-31

## 2016-01-27 MED ORDER — EPHEDRINE SULFATE 50 MG/ML IJ SOLN
INTRAMUSCULAR | Status: DC | PRN
  Administered 2016-01-27 (×2): 15 mg via INTRAVENOUS

## 2016-01-27 MED ORDER — ONDANSETRON HCL 4 MG/2ML IJ SOLN
INTRAMUSCULAR | Status: AC
  Filled 2016-01-27: qty 2

## 2016-01-27 MED ORDER — 0.9 % SODIUM CHLORIDE (POUR BTL) OPTIME
TOPICAL | Status: DC | PRN
  Administered 2016-01-27: 1000 mL

## 2016-01-27 MED ORDER — ONDANSETRON HCL 4 MG PO TABS: 4.0000 mg | ORAL_TABLET | Freq: Three times a day (TID) | ORAL | Status: AC | PRN

## 2016-01-27 MED ORDER — HYDROMORPHONE HCL 1 MG/ML IJ SOLN
INTRAMUSCULAR | Status: AC
  Administered 2016-01-27: 0.5 mg via INTRAVENOUS
  Filled 2016-01-27: qty 1

## 2016-01-27 MED ORDER — PROPOFOL 10 MG/ML IV BOLUS
INTRAVENOUS | Status: DC | PRN
  Administered 2016-01-27: 90 mg via INTRAVENOUS

## 2016-01-27 MED ORDER — FENTANYL CITRATE (PF) 250 MCG/5ML IJ SOLN
INTRAMUSCULAR | Status: AC
  Filled 2016-01-27: qty 5

## 2016-01-27 MED ORDER — CHLORHEXIDINE GLUCONATE 4 % EX LIQD
60.0000 mL | Freq: Once | CUTANEOUS | Status: DC
  Filled 2016-01-27: qty 60

## 2016-01-27 MED ORDER — DEXAMETHASONE SODIUM PHOSPHATE 10 MG/ML IJ SOLN
INTRAMUSCULAR | Status: DC | PRN
  Administered 2016-01-27: 10 mg via INTRAVENOUS

## 2016-01-27 MED ORDER — POVIDONE-IODINE 10 % EX SWAB
2.0000 | Freq: Once | CUTANEOUS | Status: DC
Start: 2016-01-27 — End: 2016-01-27

## 2016-01-27 MED ORDER — DEXAMETHASONE SODIUM PHOSPHATE 10 MG/ML IJ SOLN
INTRAMUSCULAR | Status: AC
  Filled 2016-01-27: qty 1

## 2016-01-27 MED ORDER — PROMETHAZINE HCL 25 MG/ML IJ SOLN: 6.2500 mg | INTRAMUSCULAR | Status: DC | PRN

## 2016-01-27 MED ORDER — PHENYLEPHRINE HCL 10 MG/ML IJ SOLN
INTRAMUSCULAR | Status: DC | PRN
  Administered 2016-01-27 (×2): 80 ug via INTRAVENOUS
  Administered 2016-01-27: 200 ug via INTRAVENOUS

## 2016-01-27 MED ORDER — HYDROMORPHONE HCL 1 MG/ML IJ SOLN
0.2500 mg | INTRAMUSCULAR | Status: DC | PRN
  Administered 2016-01-27 (×4): 0.5 mg via INTRAVENOUS

## 2016-01-27 MED ORDER — METHOCARBAMOL 1000 MG/10ML IJ SOLN
500.0000 mg | Freq: Four times a day (QID) | INTRAVENOUS | Status: DC | PRN
  Filled 2016-01-27: qty 5

## 2016-01-27 MED ORDER — OXYCODONE HCL 5 MG PO TABS
5.0000 mg | ORAL_TABLET | ORAL | Status: DC | PRN
  Administered 2016-01-29: 5 mg via ORAL
  Administered 2016-01-29 – 2016-01-31 (×4): 10 mg via ORAL
  Filled 2016-01-27 (×3): qty 2
  Filled 2016-01-27: qty 1
  Filled 2016-01-27: qty 2

## 2016-01-27 MED ORDER — METOCLOPRAMIDE HCL 5 MG PO TABS: 5.0000 mg | ORAL_TABLET | Freq: Three times a day (TID) | ORAL | Status: DC | PRN

## 2016-01-27 MED ORDER — LIDOCAINE HCL (CARDIAC) 20 MG/ML IV SOLN
INTRAVENOUS | Status: DC | PRN
  Administered 2016-01-27: 80 mg via INTRAVENOUS

## 2016-01-27 MED ORDER — LACTATED RINGERS IV SOLN
INTRAVENOUS | Status: DC | PRN
  Administered 2016-01-27: 15:00:00 via INTRAVENOUS

## 2016-01-27 MED ORDER — METHOCARBAMOL 500 MG PO TABS
500.0000 mg | ORAL_TABLET | Freq: Four times a day (QID) | ORAL | Status: DC | PRN
Start: 2016-01-27 — End: 2016-01-31
  Administered 2016-01-27 – 2016-01-31 (×8): 500 mg via ORAL
  Filled 2016-01-27 (×8): qty 1

## 2016-01-27 MED ORDER — DEXTROSE 5 % IV SOLN
10.0000 mg | INTRAVENOUS | Status: DC | PRN
  Administered 2016-01-27: 50 ug/min via INTRAVENOUS

## 2016-01-27 MED ORDER — ONDANSETRON HCL 4 MG PO TABS: 4.0000 mg | ORAL_TABLET | Freq: Four times a day (QID) | ORAL | Status: DC | PRN

## 2016-01-27 MED ORDER — ONDANSETRON HCL 4 MG/2ML IJ SOLN
INTRAMUSCULAR | Status: DC | PRN
  Administered 2016-01-27: 4 mg via INTRAVENOUS

## 2016-01-27 MED ORDER — MORPHINE SULFATE (PF) 2 MG/ML IV SOLN
0.5000 mg | INTRAVENOUS | Status: DC | PRN
  Administered 2016-01-28 (×2): 0.5 mg via INTRAVENOUS
  Filled 2016-01-27 (×2): qty 1

## 2016-01-27 MED ORDER — VANCOMYCIN HCL IN DEXTROSE 1-5 GM/200ML-% IV SOLN
1000.0000 mg | INTRAVENOUS | Status: AC
  Administered 2016-01-27: 1000 mg via INTRAVENOUS
  Filled 2016-01-27 (×2): qty 200

## 2016-01-27 MED ORDER — FENTANYL CITRATE (PF) 100 MCG/2ML IJ SOLN
INTRAMUSCULAR | Status: DC | PRN
  Administered 2016-01-27: 100 ug via INTRAVENOUS
  Administered 2016-01-27: 50 ug via INTRAVENOUS

## 2016-01-27 MED ORDER — MIDAZOLAM HCL 5 MG/5ML IJ SOLN
INTRAMUSCULAR | Status: DC | PRN
  Administered 2016-01-27: 2 mg via INTRAVENOUS

## 2016-01-27 MED ORDER — MIDAZOLAM HCL 2 MG/2ML IJ SOLN
INTRAMUSCULAR | Status: AC
  Filled 2016-01-27: qty 2

## 2016-01-27 MED ORDER — ROCURONIUM BROMIDE 50 MG/5ML IV SOLN
INTRAVENOUS | Status: AC
  Filled 2016-01-27: qty 1

## 2016-01-27 MED ORDER — ACETAMINOPHEN 650 MG RE SUPP: 650.0000 mg | Freq: Four times a day (QID) | RECTAL | Status: DC | PRN

## 2016-01-27 MED ORDER — ACETAMINOPHEN 500 MG PO TABS
1000.0000 mg | ORAL_TABLET | Freq: Once | ORAL | Status: DC
  Filled 2016-01-27: qty 2

## 2016-01-27 MED ORDER — ENSURE ENLIVE PO LIQD
237.0000 mL | Freq: Two times a day (BID) | ORAL | Status: DC
  Administered 2016-01-28 – 2016-01-30 (×5): 237 mL via ORAL

## 2016-01-27 MED ORDER — DEXTROSE-NACL 5-0.45 % IV SOLN: 100.0000 mL/h | INTRAVENOUS | Status: DC

## 2016-01-27 MED ORDER — ASPIRIN EC 325 MG PO TBEC: 325.0000 mg | DELAYED_RELEASE_TABLET | Freq: Every day | ORAL | Status: DC

## 2016-01-27 MED ORDER — VANCOMYCIN HCL IN DEXTROSE 1-5 GM/200ML-% IV SOLN
1000.0000 mg | Freq: Two times a day (BID) | INTRAVENOUS | Status: AC
  Administered 2016-01-28: 1000 mg via INTRAVENOUS
  Filled 2016-01-27: qty 200

## 2016-01-27 MED ORDER — OXYCODONE-ACETAMINOPHEN 5-325 MG PO TABS: 1.0000 | ORAL_TABLET | ORAL | Status: DC | PRN

## 2016-01-27 MED ORDER — LACTATED RINGERS IV SOLN
INTRAVENOUS | Status: DC
  Administered 2016-01-27: 13:00:00 via INTRAVENOUS

## 2016-01-27 MED ORDER — ROCURONIUM BROMIDE 100 MG/10ML IV SOLN
INTRAVENOUS | Status: DC | PRN
  Administered 2016-01-27: 40 mg via INTRAVENOUS
  Administered 2016-01-27: 10 mg via INTRAVENOUS

## 2016-01-27 MED ORDER — ONDANSETRON HCL 4 MG/2ML IJ SOLN: 4.0000 mg | Freq: Four times a day (QID) | INTRAMUSCULAR | Status: DC | PRN

## 2016-01-27 MED ORDER — PHENOL 1.4 % MT LIQD
1.0000 | OROMUCOSAL | Status: DC | PRN
Start: 2016-01-27 — End: 2016-01-31

## 2016-01-27 MED ORDER — ACETAMINOPHEN 325 MG PO TABS: 650.0000 mg | ORAL_TABLET | Freq: Four times a day (QID) | ORAL | Status: DC | PRN

## 2016-01-27 MED ORDER — MENTHOL 3 MG MT LOZG: 1.0000 | LOZENGE | OROMUCOSAL | Status: DC | PRN

## 2016-01-27 MED ORDER — SUGAMMADEX SODIUM 200 MG/2ML IV SOLN
INTRAVENOUS | Status: DC | PRN
  Administered 2016-01-27: 101.6 mg via INTRAVENOUS

## 2016-01-27 SURGICAL SUPPLY — 46 items
BIT DRILL AO GAMMA 4.2X180 (BIT) ×6 IMPLANT
CLOSURE STERI-STRIP 1/2X4 (GAUZE/BANDAGES/DRESSINGS) ×1
CLOSURE WOUND 1/2 X4 (GAUZE/BANDAGES/DRESSINGS) ×1
CLSR STERI-STRIP ANTIMIC 1/2X4 (GAUZE/BANDAGES/DRESSINGS) ×2 IMPLANT
COVER MAYO STAND STRL (DRAPES) ×3 IMPLANT
COVER PERINEAL POST (MISCELLANEOUS) ×3 IMPLANT
COVER SURGICAL LIGHT HANDLE (MISCELLANEOUS) ×3 IMPLANT
DRAPE PROXIMA HALF (DRAPES) ×3 IMPLANT
DRAPE STERI IOBAN 125X83 (DRAPES) ×3 IMPLANT
DRSG MEPILEX BORDER 4X4 (GAUZE/BANDAGES/DRESSINGS) ×9 IMPLANT
DURAPREP 26ML APPLICATOR (WOUND CARE) ×3 IMPLANT
ELECT REM PT RETURN 9FT ADLT (ELECTROSURGICAL) ×3
ELECTRODE REM PT RTRN 9FT ADLT (ELECTROSURGICAL) ×1 IMPLANT
GLOVE BIO SURGEON STRL SZ7 (GLOVE) ×3 IMPLANT
GLOVE BIO SURGEON STRL SZ7.5 (GLOVE) ×3 IMPLANT
GLOVE BIOGEL PI IND STRL 7.0 (GLOVE) ×2 IMPLANT
GLOVE BIOGEL PI IND STRL 8 (GLOVE) ×1 IMPLANT
GLOVE BIOGEL PI INDICATOR 7.0 (GLOVE) ×4
GLOVE BIOGEL PI INDICATOR 8 (GLOVE) ×2
GLOVE SURG SS PI 6.5 STRL IVOR (GLOVE) ×3 IMPLANT
GOWN STRL REUS W/ TWL LRG LVL3 (GOWN DISPOSABLE) ×2 IMPLANT
GOWN STRL REUS W/TWL LRG LVL3 (GOWN DISPOSABLE) ×4
GUIDEROD T2 3X1000 (ROD) ×3 IMPLANT
K-WIRE  3.2X450M STR (WIRE) ×2
K-WIRE 3.2X450M STR (WIRE) ×1
KIT BASIN OR (CUSTOM PROCEDURE TRAY) ×3 IMPLANT
KIT NAIL LONG 10X380MM (Orthopedic Implant) ×3 IMPLANT
KIT ROOM TURNOVER OR (KITS) ×3 IMPLANT
KWIRE 3.2X450M STR (WIRE) ×1 IMPLANT
MANIFOLD NEPTUNE II (INSTRUMENTS) ×3 IMPLANT
NS IRRIG 1000ML POUR BTL (IV SOLUTION) ×3 IMPLANT
PACK GENERAL/GYN (CUSTOM PROCEDURE TRAY) ×3 IMPLANT
PAD ARMBOARD 7.5X6 YLW CONV (MISCELLANEOUS) ×6 IMPLANT
SCREW LAG HIP NAIL 10.5X95 (Screw) ×3 IMPLANT
SCREW LOCKING T2 F/T  5X37.5MM (Screw) ×2 IMPLANT
SCREW LOCKING T2 F/T  5X42.5MM (Screw) ×2 IMPLANT
SCREW LOCKING T2 F/T 5X37.5MM (Screw) ×1 IMPLANT
SCREW LOCKING T2 F/T 5X42.5MM (Screw) ×1 IMPLANT
STRIP CLOSURE SKIN 1/2X4 (GAUZE/BANDAGES/DRESSINGS) ×2 IMPLANT
SUT MNCRL AB 4-0 PS2 18 (SUTURE) ×3 IMPLANT
SUT MON AB 2-0 CT1 36 (SUTURE) IMPLANT
SUT VIC AB 0 CT1 27 (SUTURE) ×2
SUT VIC AB 0 CT1 27XBRD ANBCTR (SUTURE) ×1 IMPLANT
TOWEL OR 17X24 6PK STRL BLUE (TOWEL DISPOSABLE) ×3 IMPLANT
TOWEL OR 17X26 10 PK STRL BLUE (TOWEL DISPOSABLE) ×3 IMPLANT
WATER STERILE IRR 1000ML POUR (IV SOLUTION) ×3 IMPLANT

## 2016-01-27 NOTE — H&P (View-Only) (Signed)
ORTHOPAEDIC CONSULTATION  REQUESTING PHYSICIAN: Geradine Girt, DO  Chief Complaint: Left Hip Pain  Assessment: Principal Problem:   Intertrochanteric fracture of left hip [facility]) Active Problems:   Essential hypertension   Generalized anxiety disorder   Multiple thyroid nodules   Encephalomalacia on imaging study   Closed left hip fracture ([place])  Plan: Plan for IM Nail by Dr. Alain Marion today.  Continue NPO. Weight Bearing Status: NWB - plan for WBAT post op. VTE px: SCD's and lovenox per primary.  HPI: Nicole Soto is a 65 y.o. female who complains of left hip pain after falling into a home while walking dogs in the early A.M. 01/26/16.  No loc.  Found by her neighbor after 5 hours and brought to [facility].  XR of left hip shows left proximal comminuted intertrochanteric femur fracture.  Orthopedics was consulted.  Her pain is currently 8 or 9 out of 10, but pain medicine is helping to control same.  She denies heart or lung problems excepting Hx of heavy smoking, now recently down to several per day and HTN.  She denies CP, SOB.   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 No hx DVT, PE, MI.  Past Medical History  Diagnosis Date  . Hypertension   . Anxiety   . Hepatitis A   . DDD (degenerative disc disease), cervical    Past Surgical History  Procedure Laterality Date  . Bladder surgery     Social History   Social History  . Marital Status: Single    Spouse Name: N/A  . Number of Children: 1  . Years of Education: N/A   Occupational History  . Retired    Social History Main Topics  . Smoking status: Current Every Day Smoker  . Smokeless tobacco: Not on file  . Alcohol Use: 0.0 oz/week    0 Standard drinks or equivalent per week     Comment: Very little  . Drug Use: No  . Sexual Activity: Not on file   Other Topics Concern  . Not on file   Social History Narrative   he patient smokes 3 1/2 to 5 cigarettes a day.  She  lives alone with 2 dogs. The patient has never been married, and is estranged from her daughter.     Family History  Problem Relation Age of Onset  . Heart attack Mother   . Heart attack Brother    Allergies  Allergen Reactions  . Penicillins     Syncope  . Tape Rash    M3 Tape   Positive ROS: All other systems have been reviewed and were otherwise negative with the exception of those mentioned in the HPI and as above.  Objective: Labs cbc  Recent Labs  01/26/16 1817  WBC 9.7  HGB 8.8*  HCT 26.5*  PLT 263     Recent Labs  01/26/16 1817  CREATININE 1.29*    Physical Exam: Filed Vitals:   01/26/16 1959 01/27/16 0544  BP: 95/55 104/58  Pulse: 83 73  Temp: 98.6 F (37 C) 98.6 F (37 C)  Resp: 16 16   General: Alert, no acute distress Cardiovascular: RRR, No pedal edema Respiratory: No cyanosis, no use of accessory musculature GI: abdomen is soft and non-tender Skin: No lesions in the area of chief complaint Neurologic: Sensation intact distally  Psychiatric: Patient is competent for consent with normal mood and affect Lymphatic: No axillary or cervical lymphadenopathy MUSCULOSKELETAL:  Left hip w/o skin lesion or ecchymosis.  TTP.  NVI distally.  Pain w/ active and passive motion.  Other extremities are atraumatic with painless ROM and NVI.   Prudencio Burly III PA-C 01/27/2016 7:16 AM

## 2016-01-27 NOTE — Transfer of Care (Signed)
Immediate Anesthesia Transfer of Care Note  Patient: Nicole Soto  Procedure(s) Performed: Procedure(s): INTRAMEDULLARY (IM) NAIL FEMORAL  (Left)  Patient Location: PACU  Anesthesia Type:General  Level of Consciousness: awake, alert  and oriented  Airway & Oxygen Therapy: Patient Spontanous Breathing and Patient connected to nasal cannula oxygen  Post-op Assessment: Report given to RN and Post -op Vital signs reviewed and stable  Post vital signs: Reviewed and stable  Last Vitals:  Filed Vitals:   01/26/16 1959 01/27/16 0544  BP: 95/55 104/58  Pulse: 83 73  Temp: 37 C 37 C  Resp: 16 16    Last Pain:  Filed Vitals:   01/27/16 1301  PainSc: 7       Patients Stated Pain Goal: 6 ([phone] [id number])  Complications: No apparent anesthesia complications

## 2016-01-27 NOTE — Progress Notes (Addendum)
Came to see patient in surgery.  Will check back later. Eulogio Bear DO

## 2016-01-27 NOTE — Anesthesia Preprocedure Evaluation (Addendum)
Anesthesia Evaluation  Patient identified by MRN, date of birth, ID band Patient awake    Reviewed: Allergy & Precautions, NPO status , Patient's Chart, lab work & pertinent test results  Airway Mallampati: II  TM Distance: >3 FB Neck ROM: Full    Dental  (+) Dental Advisory Given   Pulmonary Current Smoker,    breath sounds clear to auscultation       Cardiovascular hypertension, Pt. on medications  Rhythm:Regular Rate:Normal     Neuro/Psych PSYCHIATRIC DISORDERS Anxiety    GI/Hepatic negative GI ROS, (+) Hepatitis -, A  Endo/Other    Renal/GU      Musculoskeletal   Abdominal   Peds  Hematology   Anesthesia Other Findings   Reproductive/Obstetrics                           Anesthesia Physical Anesthesia Plan  ASA: III and emergent  Anesthesia Plan: General   Post-op Pain Management:    Induction: Intravenous  Airway Management Planned: Oral ETT  Additional Equipment:   Intra-op Plan:   Post-operative Plan: Extubation in OR  Informed Consent: I have reviewed the patients History and Physical, chart, labs and discussed the procedure including the risks, benefits and alternatives for the proposed anesthesia with the patient or authorized representative who has indicated his/her understanding and acceptance.   Dental advisory given  Plan Discussed with: Anesthesiologist, Surgeon and CRNA  Anesthesia Plan Comments:        Anesthesia Quick Evaluation

## 2016-01-27 NOTE — Progress Notes (Signed)
Initial Nutrition Assessment  DOCUMENTATION CODES:   Non-severe (moderate) malnutrition in context of chronic illness  INTERVENTION:  Once diet advances, provide Ensure Enlive po BID, each supplement provides 350 kcal and 20 grams of protein.  NUTRITION DIAGNOSIS:   Increased nutrient needs related to  (s/p surgery) as evidenced by estimated needs.  GOAL:   Patient will meet greater than or equal to 90% of their needs  MONITOR:   Diet advancement, Supplement acceptance, Weight trends, Labs, I & O's  REASON FOR ASSESSMENT:   Consult Assessment of nutrition requirement/status  ASSESSMENT:   65 y.o. female who complains of left hip pain after falling into a home while walking dogs in the early A.M. 01/26/16. No loc. Found by her neighbor after 5 hours and brought to [facility]. XR of left hip shows left proximal comminuted intertrochanteric femur fracture.  Pt is currently NPO for surgery today. Pt reports eating well PTA with consumption of at least 2-3 meals a day with no other difficulties. Pt reports recently weight has been stable around 118 lbs, but she does report that her weight 1 year ago was ~152 lbs. Pt with a reported 26% weight loss in 1 year ago. Pt reports weight loss was related to stress she had during that time. She reports she has no change in her diet. RD to order Ensure to aid in caloric and protein needs as well as in healing once diet advances.   Nutrition-Focused physical exam completed. Findings are severe fat depletion, moderate muscle depletion, and no edema.   Labs and medications reviewed.   Diet Order:  Diet NPO time specified Diet NPO time specified Except for: Sips with Meds  Skin:  Reviewed, no issues  Last BM:  6/17  Height:   Ht Readings from Last 1 Encounters:  01/26/16 5\' 4"  (1.626 m)    Weight:   Wt Readings from Last 1 Encounters:  01/26/16 112 lb (50.803 kg)    Ideal Body Weight:  54.5 kg  BMI:  Body mass index is 19.22  kg/(m^2).  Estimated Nutritional Needs:   Kcal:  1500-1700  Protein:  65-75 grams  Fluid:  1.5 - 1.7 L/day  EDUCATION NEEDS:   No education needs identified at this time  Corrin Parker, MS, RD, LDN Pager # [phone] After hours/ weekend pager # [phone]

## 2016-01-27 NOTE — Progress Notes (Signed)
I have reviewed her xrays from [facility] but they are not available in our system. We will obtain intraop films as well.    MURPHY, TIMOTHY D

## 2016-01-27 NOTE — Care Management Important Message (Signed)
Important Message  Patient Details  Name: Nicole Soto MRN: [id number] Date of Birth: May 18, 1951   Medicare Important Message Given:  Yes    Loann Quill 01/27/2016, 10:25 AM

## 2016-01-27 NOTE — Op Note (Signed)
DATE OF SURGERY:  01/27/2016  TIME: 4:52 PM  PATIENT NAME:  Nicole Soto  AGE: 65 y.o.  PRE-OPERATIVE DIAGNOSIS:  LEFT HIP FRACTURE  POST-OPERATIVE DIAGNOSIS:  SAME  PROCEDURE:  INTRAMEDULLARY (IM) NAIL FEMORAL   SURGEON:  Syd Manges D  ASSISTANT:  Roxan Hockey, PA-C, She was present and scrubbed throughout the case, critical for completion in a timely fashion, and for retraction, instrumentation, and closure.   OPERATIVE IMPLANTS: Stryker Gamma Nail  PREOPERATIVE INDICATIONS:  Nicole Soto is a 65 y.o. year old who fell and suffered a hip fracture. She was brought into the ER and then admitted and optimized and then elected for surgical intervention.    The risks benefits and alternatives were discussed with the patient including but not limited to the risks of nonoperative treatment, versus surgical intervention including infection, bleeding, nerve injury, malunion, nonunion, hardware prominence, hardware failure, need for hardware removal, blood clots, cardiopulmonary complications, morbidity, mortality, among others, and they were willing to proceed.    OPERATIVE PROCEDURE:  The patient was brought to the operating room and placed in the supine position. General anesthesia was administered. She was placed on the fracture table.  Closed reduction was performed under C-arm guidance. Time out was then performed after sterile prep and drape. She received preoperative antibiotics.  Incision was made proximal to the greater trochanter. I made the incision for her Lag screw and used a cobb via this approach to further reduce her fracture as it was displaced into flexion and adduction.  A guidewire was placed in the appropriate position. Confirmation was made on AP and lateral views. The above-named nail was opened. I opened the proximal femur with a reamer. I then placed the nail by hand easily down. I did not need to ream the femur.  Once the nail was completely seated, I placed a  guidepin into the femoral head into the center center position. I measured the length, and then reamed the lateral cortex and up into the head. I then placed the lag screw. Slight compression was applied. Anatomic fixation achieved. Bone quality was mediocre.  I then secured the proximal interlocking bolt, and took off a half a turn, and then removed the instruments, and took final C-arm pictures AP and lateral the entire length of the leg.  I then used perfect circles technique to place a distal interlock screw.   Anatomic reconstruction was achieved, and the wounds were irrigated copiously and closed with Vicryl followed by staples and sterile gauze for the skin. The patient was awakened and returned to PACU in stable and satisfactory condition. There no complications and the patient tolerated the procedure well.  She will be weightbearing as tolerated, and will be on chemical px  for a period of four weeks after discharge.   Edmonia Lynch, M.D.    This note was generated using a template and dragon dictation system. In light of that, I have reviewed the note and all aspects of it are applicable to this case. Any dictation errors are due to the computerized dictation system.

## 2016-01-27 NOTE — Interval H&P Note (Signed)
History and Physical Interval Note:  01/27/2016 7:47 AM  Nicole Soto  has presented today for surgery, with the diagnosis of LEFT HIP FRACTURE  The various methods of treatment have been discussed with the patient and family. After consideration of risks, benefits and other options for treatment, the patient has consented to  Procedure(s): INTRAMEDULLARY (IM) NAIL FEMORAL  (Left) as a surgical intervention .  The patient's history has been reviewed, patient examined, no change in status, stable for surgery.  I have reviewed the patient's chart and labs.  Questions were answered to the patient's satisfaction.     Elfa Wooton D

## 2016-01-27 NOTE — Anesthesia Procedure Notes (Signed)
Procedure Name: Intubation Date/Time: 01/27/2016 2:59 PM Performed by: Merrilyn Puma B Pre-anesthesia Checklist: Patient identified, Emergency Drugs available, Timeout performed, Suction available and Patient being monitored Patient Re-evaluated:Patient Re-evaluated prior to inductionOxygen Delivery Method: Circle system utilized Preoxygenation: Pre-oxygenation with 100% oxygen Intubation Type: IV induction Ventilation: Mask ventilation without difficulty Laryngoscope Size: Mac and 3 Grade View: Grade I Tube type: Oral Tube size: 7.0 mm Number of attempts: 1 Airway Equipment and Method: Stylet Placement Confirmation: CO2 detector,  positive ETCO2,  breath sounds checked- equal and bilateral and ETT inserted through vocal cords under direct vision Secured at: 21 cm Tube secured with: Tape Dental Injury: Teeth and Oropharynx as per pre-operative assessment

## 2016-01-27 NOTE — Consult Note (Signed)
ORTHOPAEDIC CONSULTATION  REQUESTING PHYSICIAN: Geradine Girt, DO  Chief Complaint: Left Hip Pain  Assessment: Principal Problem:   Intertrochanteric fracture of left hip [facility]) Active Problems:   Essential hypertension   Generalized anxiety disorder   Multiple thyroid nodules   Encephalomalacia on imaging study   Closed left hip fracture ([place])  Plan: Plan for IM Nail by Dr. Alain Marion today.  Continue NPO. Weight Bearing Status: NWB - plan for WBAT post op. VTE px: SCD's and lovenox per primary.  HPI: Nicole Soto is a 65 y.o. female who complains of left hip pain after falling into a home while walking dogs in the early A.M. 01/26/16.  No loc.  Found by her neighbor after 5 hours and brought to [facility].  XR of left hip shows left proximal comminuted intertrochanteric femur fracture.  Orthopedics was consulted.  Her pain is currently 8 or 9 out of 10, but pain medicine is helping to control same.  She denies heart or lung problems excepting Hx of heavy smoking, now recently down to several per day and HTN.  She denies CP, SOB.   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 No hx DVT, PE, MI.  Past Medical History  Diagnosis Date  . Hypertension   . Anxiety   . Hepatitis A   . DDD (degenerative disc disease), cervical    Past Surgical History  Procedure Laterality Date  . Bladder surgery     Social History   Social History  . Marital Status: Single    Spouse Name: N/A  . Number of Children: 1  . Years of Education: N/A   Occupational History  . Retired    Social History Main Topics  . Smoking status: Current Every Day Smoker  . Smokeless tobacco: Not on file  . Alcohol Use: 0.0 oz/week    0 Standard drinks or equivalent per week     Comment: Very little  . Drug Use: No  . Sexual Activity: Not on file   Other Topics Concern  . Not on file   Social History Narrative   he patient smokes 3 1/2 to 5 cigarettes a day.  She  lives alone with 2 dogs. The patient has never been married, and is estranged from her daughter.     Family History  Problem Relation Age of Onset  . Heart attack Mother   . Heart attack Brother    Allergies  Allergen Reactions  . Penicillins     Syncope  . Tape Rash    M3 Tape   Positive ROS: All other systems have been reviewed and were otherwise negative with the exception of those mentioned in the HPI and as above.  Objective: Labs cbc  Recent Labs  01/26/16 1817  WBC 9.7  HGB 8.8*  HCT 26.5*  PLT 263     Recent Labs  01/26/16 1817  CREATININE 1.29*    Physical Exam: Filed Vitals:   01/26/16 1959 01/27/16 0544  BP: 95/55 104/58  Pulse: 83 73  Temp: 98.6 F (37 C) 98.6 F (37 C)  Resp: 16 16   General: Alert, no acute distress Cardiovascular: RRR, No pedal edema Respiratory: No cyanosis, no use of accessory musculature GI: abdomen is soft and non-tender Skin: No lesions in the area of chief complaint Neurologic: Sensation intact distally  Psychiatric: Patient is competent for consent with normal mood and affect Lymphatic: No axillary or cervical lymphadenopathy MUSCULOSKELETAL:  Left hip w/o skin lesion or ecchymosis.  TTP.  NVI distally.  Pain w/ active and passive motion.  Other extremities are atraumatic with painless ROM and NVI.   Prudencio Burly III PA-C 01/27/2016 7:16 AM

## 2016-01-28 ENCOUNTER — Encounter (HOSPITAL_COMMUNITY): Payer: Self-pay | Admitting: Orthopedic Surgery

## 2016-01-28 DIAGNOSIS — S72002A Fracture of unspecified part of neck of left femur, initial encounter for closed fracture: Secondary | ICD-10-CM

## 2016-01-28 LAB — CBC
HEMATOCRIT: 28.5 % — AB (ref 36.0–46.0)
Hemoglobin: 9.6 g/dL — ABNORMAL LOW (ref 12.0–15.0)
MCH: 29.5 pg (ref 26.0–34.0)
MCHC: 33.7 g/dL (ref 30.0–36.0)
MCV: 87.7 fL (ref 78.0–100.0)
Platelets: 162 10*3/uL (ref 150–400)
RBC: 3.25 MIL/uL — ABNORMAL LOW (ref 3.87–5.11)
RDW: 17.1 % — AB (ref 11.5–15.5)
WBC: 14.7 10*3/uL — ABNORMAL HIGH (ref 4.0–10.5)

## 2016-01-28 LAB — BASIC METABOLIC PANEL
Anion gap: 6 (ref 5–15)
BUN: 22 mg/dL — AB (ref 6–20)
CALCIUM: 8.2 mg/dL — AB (ref 8.9–10.3)
CO2: 23 mmol/L (ref 22–32)
CREATININE: 0.91 mg/dL (ref 0.44–1.00)
Chloride: 107 mmol/L (ref 101–111)
GFR calc non Af Amer: >60 mL/min (ref >60)
GLUCOSE: 203 mg/dL — AB (ref 65–99)
Potassium: 4.2 mmol/L (ref 3.5–5.1)
Sodium: 136 mmol/L (ref 135–145)

## 2016-01-28 LAB — POCT I-STAT 4, (NA,K, GLUC, HGB,HCT)
Glucose, Bld: 129 mg/dL — ABNORMAL HIGH (ref 65–99)
HCT: 20 % — ABNORMAL LOW (ref 36.0–46.0)
HEMOGLOBIN: 6.8 g/dL — AB (ref 12.0–15.0)
POTASSIUM: 3.3 mmol/L — AB (ref 3.5–5.1)
Sodium: 140 mmol/L (ref 135–145)

## 2016-01-28 NOTE — Evaluation (Signed)
Occupational Therapy Evaluation Patient Details Name: Nicole Soto MRN: [id number] DOB: 25-Dec-1950 Today's Date: 01/28/2016    History of Present Illness Nicole Soto is an 65 y.o. female with a PMH of stable HTN and anxiety admitted s/p fall outside on grassy hill at night with L hip fx, now s/p IM nail.   Clinical Impression   Pt was living independently prior to admission. She takes pride in her independence and hopes to return home as soon as possible. Pt presents with L LE pain, anxiety, generalized weakness and decreased sitting balance at EOB in attempt to unload L LE. Pt requires min to total assist with ADL. OOB mobility not addressed this visit due to increased pain. Will follow acutely.    Follow Up Recommendations  CIR;Supervision/Assistance - 24 hour    Equipment Recommendations   (defer to next venue)    Recommendations for Other Services       Precautions / Restrictions Precautions Precautions: Fall Restrictions Weight Bearing Restrictions: Yes LLE Weight Bearing: Weight bearing as tolerated      Mobility Bed Mobility Overal bed mobility: Needs Assistance Bed Mobility: Rolling;Sidelying to Sit;Sit to Supine Rolling: Min guard Sidelying to sit: Min guard;HOB elevated Supine to sit: Mod assist Sit to supine: Mod assist   General bed mobility comments: increased time so pt could do as much for herself as she could, no physical assist for rolling or side to sit, mod assist for LEs back into bed  Transfers Overall transfer level: Needs assistance Equipment used: Rolling walker (2 wheeled) Transfers: Sit to/from [facility] Sit to Stand: Mod assist Stand pivot transfers: Min assist       General transfer comment: pt declined standing or transfer to chair    Balance Overall balance assessment: Needs assistance Sitting-balance support: Feet supported Sitting balance-Leahy Scale: Poor Sitting balance - Comments: pt with decreased  tolerance of weight on L hip in sitting, leaning on R elbow                               ADL Overall ADL's : Needs assistance/impaired Eating/Feeding: Independent;Bed level;Sitting   Grooming: Wash/dry hands;Wash/dry face;Oral care;Bed level;Set up   Upper Body Bathing: Minimal assitance;Sitting   Lower Body Bathing: Total assistance   Upper Body Dressing : Minimal assistance   Lower Body Dressing: Total assistance                 General ADL Comments: Pt very stoic, resistant to instruction in compensatory strategies during bed mobility.     Vision     Perception     Praxis      Pertinent Vitals/Pain Pain Assessment: 0-10 Pain Score: 8  Pain Location: L hip Pain Descriptors / Indicators: Aching Pain Intervention(s): Premedicated before session;Limited activity within patient's tolerance     Hand Dominance Right   Extremity/Trunk Assessment Upper Extremity Assessment Upper Extremity Assessment: RUE deficits/detail RUE Deficits / Details: shoulder sore from laying on it for several hours after falling, full AAROM  RUE Coordination: decreased gross motor   Lower Extremity Assessment Lower Extremity Assessment: Defer to PT evaluation LLE Deficits / Details: AAROM knee flexion about 80-90 in sitting, noted edema throughout thigh and knee; ankle AROM WFL, strength grossly 2/5       Communication Communication Communication: No difficulties   Cognition Arousal/Alertness: Awake/alert Behavior During Therapy: Anxious (very talkative) Overall Cognitive Status: Within Functional Limits for tasks assessed  General Comments       Exercises       Shoulder Instructions      Home Living Family/patient expects to be discharged to:: Inpatient rehab Living Arrangements: Alone Available Help at Discharge: Neighbor;Available PRN/intermittently Type of Home: House Home Access: Stairs to enter [employer] of  Steps: 2 Entrance Stairs-Rails: None (has a table she uses to steady herself) Home Layout: One level     Bathroom Shower/Tub: Tub/shower unit Shower/tub characteristics: Curtain Biochemist, clinical: Standard     Home Equipment: Environmental consultant - 2 wheels;Bedside commode;Adaptive equipment (has suction cup grab bars she can put up) Adaptive Equipment: Reacher Additional Comments: Reports had previous injury from fall with SNF stay at [facility] in [place]. Neighbor could install a rail if necessary.      Prior Functioning/Environment Level of Independence: Independent             OT Diagnosis: Generalized weakness;Acute pain   OT Problem List: Decreased strength;Decreased activity tolerance;Impaired balance (sitting and/or standing);Decreased safety awareness;Decreased knowledge of use of DME or AE;Pain;Impaired UE functional use   OT Treatment/Interventions: Self-care/ADL training;Balance training;Patient/family education;Therapeutic activities;DME and/or AE instruction    OT Goals(Current goals can be found in the care plan section) Acute Rehab OT Goals Patient Stated Goal: To go home OT Goal Formulation: With patient Time For Goal Achievement: 02/11/16 Potential to Achieve Goals: Good ADL Goals Pt Will Perform Grooming: standing;with supervision Pt Will Perform Upper Body Bathing: with set-up;sitting Pt Will Perform Lower Body Bathing: with supervision;with adaptive equipment;sit to/from stand Pt Will Perform Upper Body Dressing: with set-up;sitting Pt Will Perform Lower Body Dressing: with supervision;with adaptive equipment;sit to/from stand Pt Will Transfer to Toilet: with supervision;ambulating;bedside commode (over toilet) Pt Will Perform Toileting - Clothing Manipulation and hygiene: with supervision;sit to/from stand  OT Frequency: Min 2X/week   Barriers to D/C: Decreased caregiver support          Co-evaluation              End of Session Nurse Communication: Patient  requests pain meds (uncomfortable bladder)  Activity Tolerance: Patient limited by pain Patient left: in bed;with call bell/phone within reach   Time: [id number] OT Time Calculation (min): 30 min Charges:  OT General Charges $OT Visit: 1 Procedure OT Evaluation $OT Eval Moderate Complexity: 1 Procedure OT Treatments $Self Care/Home Management : 8-22 mins G-Codes:    Malka So 01/28/2016, 3:57 PM [phone]

## 2016-01-28 NOTE — Progress Notes (Signed)
PROGRESS NOTE    Nicole Soto  [id number] DOB: May 21, 1951 DOA: 01/26/2016 PCP: Neale Burly, MD   Outpatient Specialists:     Brief Narrative:  Nicole Soto is an 65 y.o. female with a PMH of stable HTN managed with HCTZ 12.5 mg daily and Benazepril 20 mg daily and anxiety managed with Xanax 1mg  TID who tells me she went outside last night at 2 a.m. to feed her dogs, and slipped while walking on a wet, grassy hill. She says she fell into a hole that came up to her shoulder and she was unable to get out on her own. She denies any LOC or head injury. She had terrible left hip pain and right shoulder pain after the fall. She was found by a neighbor 5 hours later. The neighbor called the ambulance, and the patient was brought to [facility] for further evaluation where she was found to have a left proximal comminuted intertrochanteric femur fracture.  She had surgery on 6/19 and is awaiting PT eval for disposition.   Assessment & Plan:   Principal Problem:   Intertrochanteric fracture of left hip [facility]) Active Problems:   Essential hypertension   Generalized anxiety disorder   Multiple thyroid nodules   Encephalomalacia on imaging study   Closed left hip fracture (HCC)   Malnutrition of moderate degree   Intertrochanteric fracture of left hip (HCC) - s/p surgery 6/19 -PT eval and most likely will need SW consult for SNF placement---  patient would like to go home to her dogs if possible - Vicodin / Morphine for pain control. - Robaxin PRN for muscle relaxation. - Bowel regimen initiated. She will be weightbearing as tolerated, and will be on chemical px for a period of four weeks after discharge per Dr. [name]   Essential hypertension Hold lisinopril and HCTZ for now. -resume when able   Generalized anxiety disorder Xanax ordered as needed at home dose.   Multiple thyroid nodules Will need thyroid ultrasound at some point to characterize further TSH  ok   DVT prophylaxis:  Per ortho  Code Status: Full Code   Family Communication: patient declines call to family-- says she is the last of her family  Disposition Plan:  PT eval   Consultants:   ortho  Procedures:  INTRAMEDULLARY (IM) NAIL FEMORAL    Subjective: Anxious to go home to her dogs  Objective: Filed Vitals:   01/28/16 0022 01/28/16 0023 01/28/16 0140 01/28/16 0502  BP: 81/53 87/47 89/49  101/61  Pulse: 84   77  Temp: 98.8 F (37.1 C)   98.5 F (36.9 C)  TempSrc: Oral   Oral  Resp: 16   16  Height:      Weight:      SpO2: 97%   98%    Intake/Output Summary (Last 24 hours) at 01/28/16 1312 Last data filed at 01/28/16 0841  Gross per 24 hour  Intake   2585 ml  Output   1100 ml  Net   1485 ml   Filed Weights   01/26/16 1704  Weight: 50.803 kg (112 lb)    Examination:  General exam: Appears calm and comfortable  Respiratory system: Clear to auscultation. Respiratory effort normal. Cardiovascular system: S1 & S2 heard, RRR. No JVD, murmurs, rubs, gallops or clicks. No pedal edema. Gastrointestinal system: Abdomen is nondistended, soft and nontender. No organomegaly or masses felt. Normal bowel sounds heard.     Data Reviewed: I have personally reviewed following labs and imaging studies  CBC:  Recent Labs Lab 01/26/16 1817 01/27/16 1527 01/28/16 0304  WBC 9.7  --  14.7*  HGB 8.8* 6.8* 9.6*  HCT 26.5* 20.0* 28.5*  MCV 92.7  --  87.7  PLT 263  --  0000000   Basic Metabolic Panel:  Recent Labs Lab 01/26/16 1817 01/27/16 1527 01/28/16 0304  NA  --  140 136  K  --  3.3* 4.2  CL  --   --  107  CO2  --   --  23  GLUCOSE  --  129* 203*  BUN  --   --  22*  CREATININE 1.29*  --  0.91  CALCIUM  --   --  8.2*   GFR: Estimated Creatinine Clearance: 48.8 mL/min (by C-G formula based on Cr of 0.91). Liver Function Tests: No results for input(s): AST, ALT, ALKPHOS, BILITOT, PROT, ALBUMIN in the last 168 hours. No results for  input(s): LIPASE, AMYLASE in the last 168 hours. No results for input(s): AMMONIA in the last 168 hours. Coagulation Profile: No results for input(s): INR, PROTIME in the last 168 hours. Cardiac Enzymes: No results for input(s): CKTOTAL, CKMB, CKMBINDEX, TROPONINI in the last 168 hours. BNP (last 3 results) No results for input(s): PROBNP in the last 8760 hours. HbA1C: No results for input(s): HGBA1C in the last 72 hours. CBG: No results for input(s): GLUCAP in the last 168 hours. Lipid Profile: No results for input(s): CHOL, HDL, LDLCALC, TRIG, CHOLHDL, LDLDIRECT in the last 72 hours. Thyroid Function Tests:  Recent Labs  01/26/16 1817  TSH 0.768   Anemia Panel: No results for input(s): VITAMINB12, FOLATE, FERRITIN, TIBC, IRON, RETICCTPCT in the last 72 hours. Urine analysis: No results found for: COLORURINE, APPEARANCEUR, LABSPEC, PHURINE, GLUCOSEU, HGBUR, BILIRUBINUR, KETONESUR, PROTEINUR, UROBILINOGEN, NITRITE, LEUKOCYTESUR    Recent Results (from the past 240 hour(s))  Surgical PCR screen     Status: None   Collection Time: 01/26/16  9:05 PM  Result Value Ref Range Status   MRSA, PCR NEGATIVE NEGATIVE Final   Staphylococcus aureus NEGATIVE NEGATIVE Final    Comment:        The Xpert SA Assay (FDA approved for NASAL specimens in patients over 82 years of age), is one component of a comprehensive surveillance program.  Test performance has been validated by [facility] for patients greater than or equal to 30 year old. It is not intended to diagnose infection nor to guide or monitor treatment.       Anti-infectives    Start     Dose/Rate Route Frequency Ordered Stop   01/28/16 0300  vancomycin (VANCOCIN) IVPB 1000 mg/200 mL premix     1,000 mg 200 mL/hr over 60 Minutes Intravenous Every 12 hours 01/27/16 1840 01/28/16 0434   01/27/16 0800  vancomycin (VANCOCIN) IVPB 1000 mg/200 mL premix     1,000 mg 200 mL/hr over 60 Minutes Intravenous To [facility]  Surgical 01/27/16 0747 01/27/16 1554       Radiology Studies: Pelvis Portable  01/27/2016  CLINICAL DATA:  Post nailing LEFT hip EXAM: PORTABLE PELVIS 1-2 VIEWS COMPARISON:  Intraoperative images 01/27/2016, prior radiographs 01/26/2016 FINDINGS: IM nail and compression screw within proximal LEFT femur post ORIF of an intertrochanteric fracture. Distal locking screw present. No dislocation on single AP projection. Visualized pelvis intact. Remainder of LEFT femur intact. Bones demineralized. Scattered soft tissues gas and expected postsurgical changes. IMPRESSION: Post nailing and ORIF of an intertrochanteric fracture LEFT femur. No acute complications radiographically evident. Electronically Signed  By: Lavonia Dana M.D.   On: 01/27/2016 19:01   Dg C-arm 61-120 Min  01/27/2016  CLINICAL DATA:  Imaging obtained during ORIF of a left intertrochanteric proximal femur fracture. EXAM: DG C-ARM 61-120 MIN; LEFT FEMUR 2 VIEWS COMPARISON:  6/18/7 FINDINGS: For submitted images show placement of an intra medullary rod and a compression screw reducing the major intertrochanteric fracture components into near anatomic alignment. There is no new fracture or evidence of an operative complication. The orthopedic hardware appears well seated. IMPRESSION: Well aligned major fracture components following ORIF of a left proximal femur intertrochanteric fracture. Electronically Signed   By: Lajean Manes M.D.   On: 01/27/2016 17:01   Dg Femur Min 2 Views Left  01/27/2016  CLINICAL DATA:  Imaging obtained during ORIF of a left intertrochanteric proximal femur fracture. EXAM: DG C-ARM 61-120 MIN; LEFT FEMUR 2 VIEWS COMPARISON:  6/18/7 FINDINGS: For submitted images show placement of an intra medullary rod and a compression screw reducing the major intertrochanteric fracture components into near anatomic alignment. There is no new fracture or evidence of an operative complication. The orthopedic hardware appears well  seated. IMPRESSION: Well aligned major fracture components following ORIF of a left proximal femur intertrochanteric fracture. Electronically Signed   By: Lajean Manes M.D.   On: 01/27/2016 17:01   Dg Outside Films Body  01/27/2016  This examination belongs to an outside facility and is stored here for comparison purposes only.  Contact the originating outside institution for any associated report or interpretation.       Scheduled Meds: . docusate sodium  100 mg Oral BID  . enoxaparin (LOVENOX) injection  40 mg Subcutaneous Q24H  . feeding supplement (ENSURE ENLIVE)  237 mL Oral BID BM   Continuous Infusions: . sodium chloride 125 mL/hr at 01/28/16 0335     LOS: 2 days    Time spent: 25 min    [place], DO [facility] Pager [phone]  If 7PM-7AM, please contact night-coverage [email or web address] Password [email or web address] 01/28/2016, 1:12 PM

## 2016-01-28 NOTE — Anesthesia Postprocedure Evaluation (Signed)
Anesthesia Post Note  Patient: Nicole Soto  Procedure(s) Performed: Procedure(s) (LRB): INTRAMEDULLARY (IM) NAIL FEMORAL  (Left)  Patient location during evaluation: PACU Anesthesia Type: General Level of consciousness: sedated Pain management: satisfactory to patient Vital Signs Assessment: post-procedure vital signs reviewed and stable Respiratory status: spontaneous breathing Cardiovascular status: stable Anesthetic complications: no     Last Vitals:  Filed Vitals:   01/28/16 0502 01/28/16 1408  BP: 101/61 114/64  Pulse: 77 94  Temp: 36.9 C 37.7 C  Resp: 16 16    Last Pain:  Filed Vitals:   01/28/16 1409  PainSc: 2    Pain Goal: Patients Stated Pain Goal: 3 (01/28/16 1100)               Kerby Hockley EDWARD

## 2016-01-28 NOTE — Progress Notes (Signed)
   01/28/16 1100  Clinical Encounter Type  Visited With Patient  Visit Type Initial  Spiritual Encounters  Spiritual Needs Emotional  Stress Factors  Patient Stress Factors Not reviewed  Family Stress Factors Not reviewed  Chaplain made initial contact with patient on morning rounds.  Patient very bright and sunny.  Patient very positive.  Chaplain had a lovely conversation with patient.  Chaplain made further support available if needed.

## 2016-01-28 NOTE — Evaluation (Signed)
Physical Therapy Evaluation Patient Details Name: Nicole Soto MRN: [id number] DOB: [birth year] Today's Date: 01/28/2016   History of Present Illness  Nicole Soto is an 65 y.o. female with a PMH of stable HTN and anxiety admitted s/p fall outside on grassy hill at night with L hip fx, now s/p IM nail.  Clinical Impression  Patient presents with decreased mobility due to deficits listed in PT problem list.  She will benefit from skilled PT in the acute setting to allow return home following CIR level rehab stay.  Patient motivated to get home on her own as soon as possible.     Follow Up Recommendations CIR    Equipment Recommendations  None recommended by PT    Recommendations for Other Services Rehab consult     Precautions / Restrictions Precautions Precautions: Fall Restrictions Weight Bearing Restrictions: Yes LLE Weight Bearing: Weight bearing as tolerated      Mobility  Bed Mobility Overal bed mobility: Needs Assistance Bed Mobility: Supine to Sit;Sit to Supine     Supine to sit: Mod assist Sit to supine: Mod assist   General bed mobility comments: increased time to allow pt to try on her own, assisted L LE and trunk up to sit, assisted with guiding L LE into bed and scooting over and up in bed for positioning  Transfers Overall transfer level: Needs assistance Equipment used: Rolling walker (2 wheeled) Transfers: Sit to/from [facility] Sit to Stand: Mod assist Stand pivot transfers: Min assist       General transfer comment: lifting help from EOB and from [facility] with UE support (but sat on BSC about 25 minutes), stand pivot with RW min A for balance/safety  Ambulation/Gait                Stairs            Wheelchair Mobility    Modified Rankin (Stroke Patients Only)       Balance Overall balance assessment: Needs assistance   Sitting balance-Leahy Scale: Fair     Standing balance support: Bilateral upper extremity  supported Standing balance-Leahy Scale: Poor Standing balance comment: UE support needed due to L LE pain                             Pertinent Vitals/Pain Pain Assessment: 0-10 Pain Score: 9  Pain Location: L hip with movement and R shoulder (sore all over) Pain Descriptors / Indicators: Sore Pain Intervention(s): Monitored during session;Repositioned;Premedicated before session;Ice applied    Home Living Family/patient expects to be discharged to:: Inpatient rehab Living Arrangements: Alone Available Help at Discharge: Neighbor;Available PRN/intermittently Type of Home: House       Home Layout: One level Home Equipment: [place] - 2 wheels;Bedside commode Additional Comments: Reports had previous injury from fall with SNF stay at [facility] in [place].    Prior Function Level of Independence: Independent               Hand Dominance        Extremity/Trunk Assessment   Upper Extremity Assessment: RUE deficits/detail RUE Deficits / Details: AAROM WFL, denies pain, but lifts only to about 80 shoulder flexion AROM and reports difficulty moving after lying on R shoulder 5 hours         Lower Extremity Assessment: LLE deficits/detail   LLE Deficits / Details: AAROM knee flexion about 80-90 in sitting, noted edema throughout thigh and knee; ankle AROM WFL, strength  grossly 2/5     Communication   Communication: No difficulties  Cognition Arousal/Alertness: Awake/alert Behavior During Therapy: Anxious (due to unable to urinate) Overall Cognitive Status: Within Functional Limits for tasks assessed                      General Comments General comments (skin integrity, edema, etc.): patient on bed pan when I entered, but then agreeable to try BSC, sat for 25 minutes, but still unable to urinate, RN informed    Exercises Total Joint Exercises Ankle Circles/Pumps: AROM;Both;Supine;10 reps Straight Leg Raises: AROM;Left;AAROM;5 reps;Supine       Assessment/Plan    PT Assessment Patient needs continued PT services  PT Diagnosis Difficulty walking;Acute pain;Generalized weakness   PT Problem List Decreased strength;Decreased activity tolerance;Decreased balance;Decreased mobility;Pain;Decreased knowledge of precautions;Decreased knowledge of use of DME  PT Treatment Interventions Gait training;DME instruction;Balance training;Functional mobility training;Patient/family education;Therapeutic activities;Therapeutic exercise   PT Goals (Current goals can be found in the Care Plan section) Acute Rehab PT Goals Patient Stated Goal: To go home PT Goal Formulation: With patient Time For Goal Achievement: 02/04/16 Potential to Achieve Goals: Good    Frequency Min 4X/week   Barriers to discharge        Co-evaluation               End of Session Equipment Utilized During Treatment: Gait belt Activity Tolerance: Patient limited by pain Patient left: in bed;with call bell/phone within reach Nurse Communication: Mobility status         Time: [id number] (and [id number]) PT Time Calculation (min) (ACUTE ONLY): 26 min   Charges:   PT Evaluation $PT Eval Moderate Complexity: 1 Procedure PT Treatments $Therapeutic Activity: 23-37 mins   PT G CodesReginia Naas 02/15/2016, 2:05 PM  Magda Kiel, [place] 15-Feb-2016

## 2016-01-28 NOTE — Progress Notes (Signed)
Rehab Admissions Coordinator Note:  Patient was screened by Retta Diones for appropriateness for an Inpatient Acute Rehab Consult.  At this time, we are recommending [facility].  Patient has [facility] and given current diagnosis, it is highly unlikely that we could get authorization for acute inpatient rehab admission.  Therefore, recommending SNF for therapies post discharge.  Retta Diones 01/28/2016, 4:33 PM  I can be reached at [phone].

## 2016-01-28 NOTE — Progress Notes (Signed)
   Assessment/Plan: 1 Day Post-Op   POD# 1 S/P Left hip IM Nail by Dr. Ernesta Amble. Nicole Soto on 01/27/16   Principal Problem:   Intertrochanteric fracture of left hip (HCC) Active Problems:   Essential hypertension   Generalized anxiety disorder   Multiple thyroid nodules   Encephalomalacia on imaging study   Closed left hip fracture (HCC)   Malnutrition of moderate degree   PLAN: Advance diet Up with therapy  Weight Bearing: Weight Bearing as Tolerated (WBAT) Left Leg Dressings: prn VTE prophylaxis: Lovenox then ASA 325 after d/c for 30 days Dispo: per primary  Subjective: Feeling well.  Patient reports pain as moderate, 5/10, controlled and much improved.  Hungry.  +Flatus.  Not OOB yet.  Objective:   VITALS:   Filed Vitals:   01/28/16 0022 01/28/16 0023 01/28/16 0140 01/28/16 0502  BP: 81/53 87/47 89/49  101/61  Pulse: 84   77  Temp: 98.8 F (37.1 C)   98.5 F (36.9 C)  TempSrc: Oral   Oral  Resp: 16   16  Height:      Weight:      SpO2: 97%   98%    Lab Results  Component Value Date   WBC 14.7* 01/28/2016   HGB 9.6* 01/28/2016   HCT 28.5* 01/28/2016   MCV 87.7 01/28/2016   PLT 162 01/28/2016   BMET    Component Value Date/Time   NA 136 01/28/2016 0304   K 4.2 01/28/2016 0304   CL 107 01/28/2016 0304   CO2 23 01/28/2016 0304   GLUCOSE 203* 01/28/2016 0304   BUN 22* 01/28/2016 0304   CREATININE 0.91 01/28/2016 0304   CALCIUM 8.2* 01/28/2016 0304   GFRNONAA >60 01/28/2016 0304   GFRAA >60 01/28/2016 0304     Physical Exam General: NAD.  Supine in bed.  Neurologically intact Neurovascular intact Sensation intact distally Intact pulses distally Dorsiflexion/Plantar flexion intact Incision: dressing C/D/I   Charna Elizabeth Martensen III 01/28/2016, 6:15 AM

## 2016-01-29 DIAGNOSIS — S72142A Displaced intertrochanteric fracture of left femur, initial encounter for closed fracture: Principal | ICD-10-CM

## 2016-01-29 DIAGNOSIS — E042 Nontoxic multinodular goiter: Secondary | ICD-10-CM

## 2016-01-29 DIAGNOSIS — I10 Essential (primary) hypertension: Secondary | ICD-10-CM

## 2016-01-29 LAB — CBC
HCT: 24.1 % — ABNORMAL LOW (ref 36.0–46.0)
HEMOGLOBIN: 8.1 g/dL — AB (ref 12.0–15.0)
MCH: 29.8 pg (ref 26.0–34.0)
MCHC: 33.6 g/dL (ref 30.0–36.0)
MCV: 88.6 fL (ref 78.0–100.0)
PLATELETS: 163 10*3/uL (ref 150–400)
RBC: 2.72 MIL/uL — AB (ref 3.87–5.11)
RDW: 17.4 % — ABNORMAL HIGH (ref 11.5–15.5)
WBC: 13.1 10*3/uL — ABNORMAL HIGH (ref 4.0–10.5)

## 2016-01-29 LAB — BASIC METABOLIC PANEL
Anion gap: 6 (ref 5–15)
BUN: 18 mg/dL (ref 6–20)
CHLORIDE: 111 mmol/L (ref 101–111)
CO2: 25 mmol/L (ref 22–32)
Calcium: 8.3 mg/dL — ABNORMAL LOW (ref 8.9–10.3)
Creatinine, Ser: 0.63 mg/dL (ref 0.44–1.00)
GFR calc Af Amer: >60 mL/min (ref >60)
GFR calc non Af Amer: >60 mL/min (ref >60)
GLUCOSE: 90 mg/dL (ref 65–99)
POTASSIUM: 3.9 mmol/L (ref 3.5–5.1)
Sodium: 142 mmol/L (ref 135–145)

## 2016-01-29 LAB — PREPARE RBC (CROSSMATCH)

## 2016-01-29 MED ORDER — POLYETHYLENE GLYCOL 3350 17 G PO PACK
34.0000 g | PACK | Freq: Two times a day (BID) | ORAL | Status: AC
  Administered 2016-01-29: 34 g via ORAL
  Filled 2016-01-29: qty 2

## 2016-01-29 MED ORDER — SODIUM CHLORIDE 0.9 % IV SOLN: Freq: Once | INTRAVENOUS | Status: DC

## 2016-01-29 MED ORDER — BISACODYL 5 MG PO TBEC
10.0000 mg | DELAYED_RELEASE_TABLET | Freq: Once | ORAL | Status: AC
  Administered 2016-01-29: 10 mg via ORAL
  Filled 2016-01-29: qty 2

## 2016-01-29 NOTE — Progress Notes (Signed)
Pt tearful & c/o discomfort in lower abd. Bladder scanned showed >999. 2nd I&O cath performed per [facility] protocol. 1000 cc emptied from pts bladder. Pt now resting comfortably. Nursing will continue to monitor.

## 2016-01-29 NOTE — Progress Notes (Signed)
Physical Therapy Treatment Patient Details Name: Nicole Soto MRN: [id number] DOB: 11/16/1950 Today's Date: 01/29/2016    History of Present Illness Nicole Soto is an 65 y.o. female with a PMH of stable HTN and anxiety admitted s/p fall outside on grassy hill at night with L hip fx, now s/p IM nail.    PT Comments    Patient is progressing toward mobility goals. Needs encouragement and increased time for mobility due to anxiety. Tolerated gait training well. Encouraged to sit up in chair throughout the day. Therex next session. Current plan remains appropriate.   Follow Up Recommendations  CIR     Equipment Recommendations  None recommended by PT    Recommendations for Other Services Rehab consult     Precautions / Restrictions Precautions Precautions: Fall Restrictions Weight Bearing Restrictions: Yes LLE Weight Bearing: Weight bearing as tolerated    Mobility  Bed Mobility Overal bed mobility: Needs Assistance Bed Mobility: Supine to Sit     Supine to sit: Min guard;HOB elevated     General bed mobility comments: increased time and encouragement needed; HOB elevated and use of rail  Transfers Overall transfer level: Needs assistance Equipment used: Rolling walker (2 wheeled) Transfers: Sit to/from Stand Sit to Stand: Mod assist         General transfer comment: from EOB; assist to come up into standing and to maintain balace upon stand; pt with decreased weight shift to L side in standing and required assist to weight shift; cues for hand placement and technique and safe use of AD  Ambulation/Gait Ambulation/Gait assistance: Min assist Ambulation Distance (Feet): 15 Feet Assistive device: Rolling walker (2 wheeled) Gait Pattern/deviations: Step-through pattern;Decreased step length - right;Decreased step length - left;Decreased stance time - left;Decreased weight shift to left;Trunk flexed;Narrow base of support Gait velocity: decreased   General Gait  Details: cues for increased bilat step length and increased WB on L LE wich pt was able to correct but needed max cues due to anxiety; pt kep repeating "i can't" and needed max encouragement to ambulate in room bed to chair; cues for posture and assist for management of RW   Stairs            Wheelchair Mobility    Modified Rankin (Stroke Patients Only)       Balance Overall balance assessment: Needs assistance Sitting-balance support: Bilateral upper extremity supported;Feet supported Sitting balance-Leahy Scale: Fair     Standing balance support: Bilateral upper extremity supported Standing balance-Leahy Scale: Poor                      Cognition Arousal/Alertness: Awake/alert Behavior During Therapy: Anxious (very anxious with all mobility) Overall Cognitive Status: Within Functional Limits for tasks assessed                      Exercises      General Comments General comments (skin integrity, edema, etc.): encouraged pt to sit up in chair throughout the day and to always use BSC instead of bed pan      Pertinent Vitals/Pain Pain Assessment: 0-10 Pain Score: 6  Pain Location: L hip Pain Descriptors / Indicators: Aching;Grimacing;Guarding;Sore Pain Intervention(s): Limited activity within patient's tolerance;Monitored during session;Premedicated before session;Patient requesting pain meds-RN notified;Repositioned    Home Living                      Prior Function  PT Goals (current goals can now be found in the care plan section) Acute Rehab PT Goals Patient Stated Goal: none stated Progress towards PT goals: Progressing toward goals    Frequency  Min 4X/week    PT Plan Current plan remains appropriate    Co-evaluation             End of Session Equipment Utilized During Treatment: Gait belt Activity Tolerance: Patient limited by pain;Other (comment) (anxiety) Patient left: in chair;with call bell/phone  within reach;with nursing/sitter in room     Time: 0937-1019 PT Time Calculation (min) (ACUTE ONLY): 42 min  Charges:  $Gait Training: 8-22 mins $Therapeutic Activity: 23-37 mins                    G Codes:      Salina April, PTA Pager: [phone]   01/29/2016, 11:25 AM

## 2016-01-29 NOTE — Progress Notes (Signed)
   Assessment/Plan: 2 Days Post-Op   S/P Left hip IM Nail by Dr. Ernesta Amble. [name] on 01/27/16   Principal Problem:   Intertrochanteric fracture of left hip [facility]) Active Problems:   Essential hypertension   Generalized anxiety disorder   Multiple thyroid nodules   Encephalomalacia on imaging study   Closed left hip fracture (HCC)   Malnutrition of moderate degree Acute Blood loss anemia Hgb 8.1 from 9.6- likely with dilutional component. Urinary retention, foley placed over night.    PLAN: Will transfuse 2 units Up with therapy  Weight Bearing: Weight Bearing as Tolerated (WBAT) Left Leg Dressings: prn VTE prophylaxis: Lovenox then ASA 325 after d/c for 30 days Dispo: Nicole primary  Subjective: Feeling well.  Patient reports pain as improvig, controlled.  Elmer Bales, drinking.  +Flatus.    Objective:   VITALS:   Filed Vitals:   01/28/16 0140 01/28/16 0502 01/28/16 1408 01/28/16 2116  BP: 89/49 101/61 114/64 109/59  Pulse:  77 94 91  Temp:  98.5 F (36.9 C) 99.9 F (37.7 C) 98.9 F (37.2 C)  TempSrc:  Oral Oral Oral  Resp:  16 16 16   Height:      Weight:      SpO2:  98% 99% 96%    Lab Results  Component Value Date   WBC 13.1* 01/29/2016   HGB 8.1* 01/29/2016   HCT 24.1* 01/29/2016   MCV 88.6 01/29/2016   PLT 163 01/29/2016   BMET    Component Value Date/Time   NA 142 01/29/2016 0450   K 3.9 01/29/2016 0450   CL 111 01/29/2016 0450   CO2 25 01/29/2016 0450   GLUCOSE 90 01/29/2016 0450   BUN 18 01/29/2016 0450   CREATININE 0.63 01/29/2016 0450   CALCIUM 8.3* 01/29/2016 0450   GFRNONAA >60 01/29/2016 0450   GFRAA >60 01/29/2016 0450     Physical Exam General: NAD.  Supine in bed.  Neurologically intact Neurovascular intact Sensation intact distally Intact pulses distally Dorsiflexion/Plantar flexion intact Incision: dressing C/D/I   Prudencio Burly III 01/29/2016, 6:29 AM

## 2016-01-29 NOTE — NC FL2 (Signed)
[place] LEVEL OF CARE SCREENING TOOL     IDENTIFICATION  Patient Name: Nicole Soto Birthdate: [birth year] Sex: female Admission Date (Current Location): 01/26/2016  [facility] and [place] Number:  Herbalist and Address:  [facility], [place] [address] [place],  [address]      Provider Number: [id number]  Attending Physician Name and Address:  Albertine Patricia, MD  Relative Name and Phone Number:       Current Level of Care: Hospital Recommended Level of Care: [place] Prior Approval Number:    Date Approved/Denied:   PASRR Number:  ([place] placement, no PASARR required.)  Discharge Plan: SNF    Current Diagnoses: Patient Active Problem List   Diagnosis Date Noted  . Malnutrition of moderate degree 01/27/2016  . Intertrochanteric fracture of left hip ([place]) 01/26/2016  . Essential hypertension 01/26/2016  . Generalized anxiety disorder 01/26/2016  . Multiple thyroid nodules 01/26/2016  . Encephalomalacia on imaging study 01/26/2016  . Closed left hip fracture ([place]) 01/26/2016    Orientation RESPIRATION BLADDER Height & Weight     Self, Time, Situation, Place  Normal Continent Weight: 112 lb (50.803 kg) Height:  5\' 4"  (162.6 cm)  BEHAVIORAL SYMPTOMS/MOOD NEUROLOGICAL BOWEL NUTRITION STATUS      Continent Diet (Please see discharge summary.)  AMBULATORY STATUS COMMUNICATION OF NEEDS Skin   Limited Assist Verbally Surgical wounds                       Personal Care Assistance Level of Assistance  Bathing, Feeding, Dressing Bathing Assistance: Maximum assistance Feeding assistance: Independent Dressing Assistance: Limited assistance     Functional Limitations Info             SPECIAL CARE FACTORS FREQUENCY  PT (By licensed PT), OT (By licensed OT)     PT Frequency: 5 OT Frequency: 5            Contractures      Additional Factors Info  Code Status, Allergies Code  Status Info: FULL Allergies Info: Penicillins, Tape           Current Medications (01/29/2016):  This is the current hospital active medication list Current Facility-Administered Medications  Medication Dose Route Frequency Provider Last Rate Last Dose  . 0.9 %  sodium chloride infusion   Intravenous Once Geradine Girt, DO      . acetaminophen (TYLENOL) tablet 650 mg  650 mg Oral Q6H PRN Geradine Girt, DO       Or  . acetaminophen (TYLENOL) suppository 650 mg  650 mg Rectal Q6H PRN Geradine Girt, DO      . ALPRAZolam Duanne Moron) tablet 1 mg  1 mg Oral TID PRN Venetia Maxon Rama, MD   1 mg at 01/28/16 2137  . bisacodyl (DULCOLAX) EC tablet 10 mg  10 mg Oral Once Albertine Patricia, MD      . bisacodyl (DULCOLAX) suppository 10 mg  10 mg Rectal Daily PRN Venetia Maxon Rama, MD      . docusate sodium (COLACE) capsule 100 mg  100 mg Oral BID Venetia Maxon Rama, MD   100 mg at 01/28/16 2137  . enoxaparin (LOVENOX) injection 40 mg  40 mg Subcutaneous Q24H Venetia Maxon Rama, MD   40 mg at 01/28/16 2005  . feeding supplement (ENSURE ENLIVE) (ENSURE ENLIVE) liquid 237 mL  237 mL Oral BID BM Jessica U Vann, DO   237 mL  at 01/28/16 1411  . hydrALAZINE (APRESOLINE) injection 10 mg  10 mg Intravenous Q6H PRN Venetia Maxon Rama, MD      . HYDROcodone-acetaminophen (NORCO/VICODIN) 5-325 MG per tablet 1-2 tablet  1-2 tablet Oral Q6H PRN Venetia Maxon Rama, MD   2 tablet at 01/29/16 0259  . menthol-cetylpyridinium (CEPACOL) lozenge 3 mg  1 lozenge Oral PRN Geradine Girt, DO       Or  . phenol (CHLORASEPTIC) mouth spray 1 spray  1 spray Mouth/Throat PRN Geradine Girt, DO      . methocarbamol (ROBAXIN) tablet 500 mg  500 mg Oral Q6H PRN Geradine Girt, DO   500 mg at 01/29/16 0606   Or  . methocarbamol (ROBAXIN) 500 mg in dextrose 5 % 50 mL IVPB  500 mg Intravenous Q6H PRN Geradine Girt, DO      . metoCLOPramide (REGLAN) tablet 5-10 mg  5-10 mg Oral Q8H PRN Geradine Girt, DO       Or  . metoCLOPramide (REGLAN)  injection 5-10 mg  5-10 mg Intravenous Q8H PRN Geradine Girt, DO      . morphine 2 MG/ML injection 0.5 mg  0.5 mg Intravenous Q2H PRN Geradine Girt, DO   0.5 mg at 01/28/16 2137  . ondansetron (ZOFRAN) tablet 4 mg  4 mg Oral Q6H PRN Geradine Girt, DO       Or  . ondansetron (ZOFRAN) injection 4 mg  4 mg Intravenous Q6H PRN Geradine Girt, DO      . oxyCODONE (Oxy IR/ROXICODONE) immediate release tablet 5-10 mg  5-10 mg Oral Q4H PRN Geradine Girt, DO   5 mg at 01/29/16 0606  . polyethylene glycol (MIRALAX / GLYCOLAX) packet 17 g  17 g Oral Daily PRN Christina P Rama, MD      . polyethylene glycol (MIRALAX / GLYCOLAX) packet 34 g  34 g Oral BID Albertine Patricia, MD         Discharge Medications: Please see discharge summary for a list of discharge medications.  Relevant Imaging Results:  Relevant Lab Results:   Additional Information SSN: [id number]  Caroline Sauger, LCSW

## 2016-01-29 NOTE — Progress Notes (Signed)
PROGRESS NOTE    Nicole Soto  [id number] DOB: [birth year] DOA: 01/26/2016 PCP: Neale Burly, MD   Outpatient Specialists:     Brief Narrative:  Nicole Soto is an 65 y.o. female with a PMH of stable HTN  and anxiety, presents with a full She denies any LOC or head injury. She had terrible left hip pain and right shoulder pain after the fall. She was found by a neighbor 5 hours later. The neighbor called the ambulance, and the patient was brought to [facility] for further evaluation where she was found to have a left proximal comminuted intertrochanteric femur fracture.  She had surgery on 6/19 and is awaiting PT eval for disposition.   Assessment & Plan:   Principal Problem:   Intertrochanteric fracture of left hip (HCC) Active Problems:   Essential hypertension   Generalized anxiety disorder   Multiple thyroid nodules   Encephalomalacia on imaging study   Closed left hip fracture (HCC)   Malnutrition of moderate degree   Intertrochanteric fracture of left hip [facility]) - s/p surgery 6/19 by Dr. Edmonia Lynch - Seen by PT, will need SNF placement - Continue when necessary pain medicine  Anemia - Postoperative anemia, transfused 2 units PRBC on 6/19, hemoglobin dropped from 9.7-8.1 today, will be transfused another 2 units PRBC.  Essential hypertension - BP labile, continue Hold lisinopril and HCTZ for now.  Generalized anxiety disorder - Xanax ordered as needed at home dose.  Multiple thyroid nodules - Further workup as an outpatient ,Will need thyroid ultrasound at some point to characterize further - TSH ok   DVT prophylaxis:  On lovenox  Code Status: Full Code   Family Communication: None at bedside  Disposition Plan:  Will need SNF placment in 2 days.   Consultants:   ortho  Procedures:  INTRAMEDULLARY (IM) NAIL FEMORAL  - 3 units PRBC transfusion on 6/19   Subjective: Denies any complaints, reports her pain is controlled, reports  last bowel movement for 3 days.  Objective: Filed Vitals:   01/28/16 2116 01/29/16 0825 01/29/16 0945 01/29/16 1034  BP: 109/59 100/52 85/43 102/48  Pulse: 91 79 89 80  Temp: 98.9 F (37.2 C) 98.6 F (37 C) 98.6 F (37 C) 98.6 F (37 C)  TempSrc: Oral  Oral Oral  Resp: 16 16 16 16   Height:      Weight:      SpO2: 96% 98% 99% 96%    Intake/Output Summary (Last 24 hours) at 01/29/16 1113 Last data filed at 01/29/16 1015  Gross per 24 hour  Intake   1297 ml  Output   2550 ml  Net  -1253 ml   Filed Weights   01/26/16 1704  Weight: 50.803 kg (112 lb)    Examination:  General exam: Awake, alert, in no apparent distress  Respiratory system: Clear to auscultation. Respiratory effort normal. Cardiovascular system: S1 & S2 heard, RRR. No JVD, murmurs, rubs, gallops or clicks. No pedal edema. Gastrointestinal system: Abdomen is nondistended, soft and nontender. No organomegaly or masses felt. Normal bowel sounds heard. Extremities: Pulses felt bilaterally, no edema     Data Reviewed: I have personally reviewed following labs and imaging studies  CBC:  Recent Labs Lab 01/26/16 1817 01/27/16 1527 01/28/16 0304 01/29/16 0450  WBC 9.7  --  14.7* 13.1*  HGB 8.8* 6.8* 9.6* 8.1*  HCT 26.5* 20.0* 28.5* 24.1*  MCV 92.7  --  87.7 88.6  PLT 263  --  162 163   Basic  Metabolic Panel:  Recent Labs Lab 01/26/16 1817 01/27/16 1527 01/28/16 0304 01/29/16 0450  NA  --  140 136 142  K  --  3.3* 4.2 3.9  CL  --   --  107 111  CO2  --   --  23 25  GLUCOSE  --  129* 203* 90  BUN  --   --  22* 18  CREATININE 1.29*  --  0.91 0.63  CALCIUM  --   --  8.2* 8.3*   GFR: Estimated Creatinine Clearance: 55.5 mL/min (by C-G formula based on Cr of 0.63). Liver Function Tests: No results for input(s): AST, ALT, ALKPHOS, BILITOT, PROT, ALBUMIN in the last 168 hours. No results for input(s): LIPASE, AMYLASE in the last 168 hours. No results for input(s): AMMONIA in the last 168  hours. Coagulation Profile: No results for input(s): INR, PROTIME in the last 168 hours. Cardiac Enzymes: No results for input(s): CKTOTAL, CKMB, CKMBINDEX, TROPONINI in the last 168 hours. BNP (last 3 results) No results for input(s): PROBNP in the last 8760 hours. HbA1C: No results for input(s): HGBA1C in the last 72 hours. CBG: No results for input(s): GLUCAP in the last 168 hours. Lipid Profile: No results for input(s): CHOL, HDL, LDLCALC, TRIG, CHOLHDL, LDLDIRECT in the last 72 hours. Thyroid Function Tests:  Recent Labs  01/26/16 1817  TSH 0.768   Anemia Panel: No results for input(s): VITAMINB12, FOLATE, FERRITIN, TIBC, IRON, RETICCTPCT in the last 72 hours. Urine analysis: No results found for: COLORURINE, APPEARANCEUR, LABSPEC, PHURINE, GLUCOSEU, HGBUR, BILIRUBINUR, KETONESUR, PROTEINUR, UROBILINOGEN, NITRITE, LEUKOCYTESUR    Recent Results (from the past 240 hour(s))  Surgical PCR screen     Status: None   Collection Time: 01/26/16  9:05 PM  Result Value Ref Range Status   MRSA, PCR NEGATIVE NEGATIVE Final   Staphylococcus aureus NEGATIVE NEGATIVE Final    Comment:        The Xpert SA Assay (FDA approved for NASAL specimens in patients over 34 years of age), is one component of a comprehensive surveillance program.  Test performance has been validated by [facility] for patients greater than or equal to 67 year old. It is not intended to diagnose infection nor to guide or monitor treatment.       Anti-infectives    Start     Dose/Rate Route Frequency Ordered Stop   01/28/16 0300  vancomycin (VANCOCIN) IVPB 1000 mg/200 mL premix     1,000 mg 200 mL/hr over 60 Minutes Intravenous Every 12 hours 01/27/16 1840 01/28/16 0434   01/27/16 0800  vancomycin (VANCOCIN) IVPB 1000 mg/200 mL premix     1,000 mg 200 mL/hr over 60 Minutes Intravenous To [facility] 01/27/16 0747 01/27/16 1554       Radiology Studies: Pelvis Portable  01/27/2016   CLINICAL DATA:  Post nailing LEFT hip EXAM: PORTABLE PELVIS 1-2 VIEWS COMPARISON:  Intraoperative images 01/27/2016, prior radiographs 01/26/2016 FINDINGS: IM nail and compression screw within proximal LEFT femur post ORIF of an intertrochanteric fracture. Distal locking screw present. No dislocation on single AP projection. Visualized pelvis intact. Remainder of LEFT femur intact. Bones demineralized. Scattered soft tissues gas and expected postsurgical changes. IMPRESSION: Post nailing and ORIF of an intertrochanteric fracture LEFT femur. No acute complications radiographically evident. Electronically Signed   By: Lavonia Dana M.D.   On: 01/27/2016 19:01   Dg C-arm 61-120 Min  01/27/2016  CLINICAL DATA:  Imaging obtained during ORIF of a left intertrochanteric proximal femur fracture.  EXAM: DG C-ARM 61-120 MIN; LEFT FEMUR 2 VIEWS COMPARISON:  6/18/7 FINDINGS: For submitted images show placement of an intra medullary rod and a compression screw reducing the major intertrochanteric fracture components into near anatomic alignment. There is no new fracture or evidence of an operative complication. The orthopedic hardware appears well seated. IMPRESSION: Well aligned major fracture components following ORIF of a left proximal femur intertrochanteric fracture. Electronically Signed   By: Lajean Manes M.D.   On: 01/27/2016 17:01   Dg Femur Min 2 Views Left  01/27/2016  CLINICAL DATA:  Imaging obtained during ORIF of a left intertrochanteric proximal femur fracture. EXAM: DG C-ARM 61-120 MIN; LEFT FEMUR 2 VIEWS COMPARISON:  6/18/7 FINDINGS: For submitted images show placement of an intra medullary rod and a compression screw reducing the major intertrochanteric fracture components into near anatomic alignment. There is no new fracture or evidence of an operative complication. The orthopedic hardware appears well seated. IMPRESSION: Well aligned major fracture components following ORIF of a left proximal femur  intertrochanteric fracture. Electronically Signed   By: Lajean Manes M.D.   On: 01/27/2016 17:01   Dg Outside Films Body  01/27/2016  This examination belongs to an outside facility and is stored here for comparison purposes only.  Contact the originating outside institution for any associated report or interpretation.       Scheduled Meds: . sodium chloride   Intravenous Once  . docusate sodium  100 mg Oral BID  . enoxaparin (LOVENOX) injection  40 mg Subcutaneous Q24H  . feeding supplement (ENSURE ENLIVE)  237 mL Oral BID BM   Continuous Infusions:     LOS: 3 days    Time spent: 25 min    Randye Treichler, MD [facility] Pager [phone]  If 7PM-7AM, please contact night-coverage [email or web address] Password [email or web address] 01/29/2016, 11:13 AM

## 2016-01-29 NOTE — Clinical Social Work Placement (Signed)
   CLINICAL SOCIAL WORK PLACEMENT  NOTE  Date:  01/29/2016  Patient Details  Name: Nicole Soto MRN: [id number] Date of Birth: 1951/01/13  Clinical Social Work is seeking post-discharge placement for this patient at the [place] level of care (*CSW will initial, date and re-position this form in  chart as items are completed):  Yes   Patient/family provided with [place] Work Department's list of facilities offering this level of care within the geographic area requested by the patient (or if unable, by the patient's family).  Yes   Patient/family informed of their freedom to choose among providers that offer the needed level of care, that participate in Medicare, Medicaid or managed care program needed by the patient, have an available bed and are willing to accept the patient.  Yes   Patient/family informed of [place]'s ownership interest in [facility] and [facility], as well as of the fact that they are under no obligation to receive care at these facilities.  PASRR submitted to [employer] on  ([place])     PASRR number received on  ([place])     Existing PASRR number confirmed on       FL2 transmitted to all facilities in geographic area requested by pt/family on 01/29/16     FL2 transmitted to all facilities within larger geographic area on       Patient informed that his/her managed care company has contracts with or will negotiate with certain facilities, including the following:        Yes   Patient/family informed of bed offers received.  Patient chooses bed at [facility] and [facility]     Physician recommends and patient chooses bed at      Patient to be transferred to [facility] on  .  Patient to be transferred to facility by [facility]     Patient family notified on   of transfer.  Name of family member notified:        PHYSICIAN Please sign FL2     Additional Comment:     _______________________________________________ Caroline Sauger, LCSW 01/29/2016, 2:07 PM

## 2016-01-29 NOTE — Progress Notes (Signed)
Orthopedic Tech Progress Note Patient Details:  Nicole Soto July 10, 1951 [id number]  Patient ID: [name], female   DOB: June 03, 1951, 65 y.o.   MRN: [id number]   [place] Pink 01/29/2016, 2:30 PMTrapeze bar

## 2016-01-29 NOTE — Clinical Social Work Note (Signed)
Clinical Social Work Assessment  Patient Details  Name: Nicole Soto MRN: [id number] Date of Birth: 01/14/1951  Date of referral:  01/29/16               Reason for consult:  Facility Placement, Discharge Planning                Permission sought to share information with:  Facility Contact Representative Permission granted to share information::  Yes, Verbal Permission Granted  Name::        Agency::  [facility] (in [place])  Relationship::     Contact Information:     Housing/Transportation Living arrangements for the past 2 months:  Single Family Home Source of Information:  Patient Patient Interpreter Needed:  None Criminal Activity/Legal Involvement Pertinent to Current Situation/Hospitalization:  No - Comment as needed Significant Relationships:  Other(Comment) (Did not disclose.) Lives with:  Self Do you feel safe going back to the place where you live?  No Need for family participation in patient care:  No (Coment) (Patient able to make own decisions.)  Care giving concerns:  Patient expressed no concerns at this time.   Social Worker assessment / plan:  LCSW received referral for possible SNF placement at time of discharge. LCSW met with patient at bedside to discuss discharge planning. Per patient, patient would prefer to discharge to [facility] (located in [place]). LCSW to continue to follow and assist with discharge planning needs.  Employment status:  Other (Comment) (Did not disclose.) Insurance information:  Managed Medicare ([employer]) PT Recommendations:  Inpatient Rehab Consult, Skilled Nursing Facility Information / Referral to community resources:  Skilled Nursing Facility  Patient/Family's Response to care:  Patient understanding and agreeable to LCSW plan of care.  Patient/Family's Understanding of and Emotional Response to Diagnosis, Current Treatment, and Prognosis:  Patient understanding and agreeable to LCSW  plan of care.  Emotional Assessment Appearance:  Appears stated age Attitude/Demeanor/Rapport:  Other (Appropriate.) Affect (typically observed):  Accepting, Appropriate, Pleasant Orientation:  Oriented to Self, Oriented to Place, Oriented to  Time, Oriented to Situation Alcohol / Substance use:  Not Applicable Psych involvement (Current and /or in the community):  No (Comment) (Not appropriate on this admission.)  Discharge Needs  Concerns to be addressed:  No discharge needs identified Readmission within the last 30 days:  No Current discharge risk:  None Barriers to Discharge:  No Barriers Identified   Vaughn, Emily S, LCSW 01/29/2016, 1:54 PM [phone] 

## 2016-01-29 NOTE — Progress Notes (Signed)
Pt attempted to void, still unable to. Bladder scan showed 617. Per [facility] Protocol new foley catheter placed d/t urinary retention. Nursing will continue to monitor.

## 2016-01-30 DIAGNOSIS — D649 Anemia, unspecified: Secondary | ICD-10-CM

## 2016-01-30 DIAGNOSIS — S72002S Fracture of unspecified part of neck of left femur, sequela: Secondary | ICD-10-CM

## 2016-01-30 LAB — TYPE AND SCREEN
ABO/RH(D): O POS
Antibody Screen: NEGATIVE
UNIT DIVISION: 0
UNIT DIVISION: 0
UNIT DIVISION: 0
UNIT DIVISION: 0
Unit division: 0

## 2016-01-30 LAB — CBC
HEMATOCRIT: 31.2 % — AB (ref 36.0–46.0)
HEMOGLOBIN: 10.1 g/dL — AB (ref 12.0–15.0)
MCH: 28.5 pg (ref 26.0–34.0)
MCHC: 32.4 g/dL (ref 30.0–36.0)
MCV: 88.1 fL (ref 78.0–100.0)
PLATELETS: 195 10*3/uL (ref 150–400)
RBC: 3.54 MIL/uL — AB (ref 3.87–5.11)
RDW: 17.3 % — ABNORMAL HIGH (ref 11.5–15.5)
WBC: 10.8 10*3/uL — ABNORMAL HIGH (ref 4.0–10.5)

## 2016-01-30 NOTE — Progress Notes (Signed)
PROGRESS NOTE    Yanelli Helfgott  [id number] DOB: [birth year] DOA: 01/26/2016 PCP: Neale Burly, MD   Outpatient Specialists:     Brief Narrative:  Lyndora Arceo is an 65 y.o. female with a PMH of stable HTN  and anxiety, presents with a full She denies any LOC or head injury. She had terrible left hip pain and right shoulder pain after the fall. She was found by a neighbor 5 hours later. The neighbor called the ambulance, and the patient was brought to [facility] for further evaluation where she was found to have a left proximal comminuted intertrochanteric femur fracture.  She had surgery on 6/19 , required multiple PRBC transfusion for postoperative anemia.   Assessment & Plan:   Principal Problem:   Intertrochanteric fracture of left hip [facility]) Active Problems:   Essential hypertension   Generalized anxiety disorder   Multiple thyroid nodules   Encephalomalacia on imaging study   Closed left hip fracture (HCC)   Malnutrition of moderate degree   Intertrochanteric fracture of left hip [facility]) - s/p surgery 6/19 by Dr. Edmonia Lynch - Seen by PT, will need SNF placement, awaiting bed availability, should be available tomorrow. - Continue when necessary pain medicine  Anemia - Postoperative anemia, quiet total 5 units PRBC transfusion postoperatively, 3 units on 6/19, 2 units on 6/21, hemoglobin remained stable posttransfusion today, recheck in a.m..  Essential hypertension - BP remains labile, continue Hold lisinopril and HCTZ for now.  Generalized anxiety disorder - Xanax ordered as needed at home dose.  Multiple thyroid nodules - Further workup as an outpatient ,Will need thyroid ultrasound at some point to characterize further - TSH ok   DVT prophylaxis:  On lovenox  Code Status: Full Code   Family Communication: None at bedside  Disposition Plan:  For  SNF placement, wound bed is available, hopefully by tomorrow   Consultants:    ortho  Procedures:  INTRAMEDULLARY (IM) NAIL FEMORAL  - 3 units PRBC transfusion on 6/19   Subjective: Denies any complaints, reports her pain is controlled, reports last bowel movement for 3 days.  Objective: Filed Vitals:   01/29/16 1434 01/29/16 1700 01/29/16 2051 01/30/16 0529  BP: 98/52 97/52 146/68 125/67  Pulse: 64 64 97 87  Temp: 98 F (36.7 C) 98 F (36.7 C) 98.2 F (36.8 C) 98.4 F (36.9 C)  TempSrc: Oral Oral Oral Oral  Resp: 16 16 16 16   Height:      Weight:      SpO2: 99% 99% 97% 98%    Intake/Output Summary (Last 24 hours) at 01/30/16 1136 Last data filed at 01/30/16 0900  Gross per 24 hour  Intake    480 ml  Output   2250 ml  Net  -1770 ml   Filed Weights   01/26/16 1704  Weight: 50.803 kg (112 lb)    Examination:  General exam: Awake, alert, in no apparent distress  Respiratory system: Clear to auscultation. Respiratory effort normal. Cardiovascular system: S1 & S2 heard, RRR. No JVD, murmurs, rubs, gallops or clicks. No pedal edema. Gastrointestinal system: Abdomen is nondistended, soft and nontender. No organomegaly or masses felt. Normal bowel sounds heard. Extremities: Pulses felt bilaterally, no edema     Data Reviewed: I have personally reviewed following labs and imaging studies  CBC:  Recent Labs Lab 01/26/16 1817 01/27/16 1527 01/28/16 0304 01/29/16 0450 01/30/16 0625  WBC 9.7  --  14.7* 13.1* 10.8*  HGB 8.8* 6.8* 9.6* 8.1* 10.1*  HCT  26.5* 20.0* 28.5* 24.1* 31.2*  MCV 92.7  --  87.7 88.6 88.1  PLT 263  --  162 163 0000000   Basic Metabolic Panel:  Recent Labs Lab 01/26/16 1817 01/27/16 1527 01/28/16 0304 01/29/16 0450  NA  --  140 136 142  K  --  3.3* 4.2 3.9  CL  --   --  107 111  CO2  --   --  23 25  GLUCOSE  --  129* 203* 90  BUN  --   --  22* 18  CREATININE 1.29*  --  0.91 0.63  CALCIUM  --   --  8.2* 8.3*   GFR: Estimated Creatinine Clearance: 55.5 mL/min (by C-G formula based on Cr of 0.63). Liver  Function Tests: No results for input(s): AST, ALT, ALKPHOS, BILITOT, PROT, ALBUMIN in the last 168 hours. No results for input(s): LIPASE, AMYLASE in the last 168 hours. No results for input(s): AMMONIA in the last 168 hours. Coagulation Profile: No results for input(s): INR, PROTIME in the last 168 hours. Cardiac Enzymes: No results for input(s): CKTOTAL, CKMB, CKMBINDEX, TROPONINI in the last 168 hours. BNP (last 3 results) No results for input(s): PROBNP in the last 8760 hours. HbA1C: No results for input(s): HGBA1C in the last 72 hours. CBG: No results for input(s): GLUCAP in the last 168 hours. Lipid Profile: No results for input(s): CHOL, HDL, LDLCALC, TRIG, CHOLHDL, LDLDIRECT in the last 72 hours. Thyroid Function Tests: No results for input(s): TSH, T4TOTAL, FREET4, T3FREE, THYROIDAB in the last 72 hours. Anemia Panel: No results for input(s): VITAMINB12, FOLATE, FERRITIN, TIBC, IRON, RETICCTPCT in the last 72 hours. Urine analysis: No results found for: COLORURINE, APPEARANCEUR, LABSPEC, PHURINE, GLUCOSEU, HGBUR, BILIRUBINUR, KETONESUR, PROTEINUR, UROBILINOGEN, NITRITE, LEUKOCYTESUR    Recent Results (from the past 240 hour(s))  Surgical PCR screen     Status: None   Collection Time: 01/26/16  9:05 PM  Result Value Ref Range Status   MRSA, PCR NEGATIVE NEGATIVE Final   Staphylococcus aureus NEGATIVE NEGATIVE Final    Comment:        The Xpert SA Assay (FDA approved for NASAL specimens in patients over 19 years of age), is one component of a comprehensive surveillance program.  Test performance has been validated by [facility] for patients greater than or equal to 12 year old. It is not intended to diagnose infection nor to guide or monitor treatment.       Anti-infectives    Start     Dose/Rate Route Frequency Ordered Stop   01/28/16 0300  vancomycin (VANCOCIN) IVPB 1000 mg/200 mL premix     1,000 mg 200 mL/hr over 60 Minutes Intravenous Every 12 hours  01/27/16 1840 01/28/16 0434   01/27/16 0800  vancomycin (VANCOCIN) IVPB 1000 mg/200 mL premix     1,000 mg 200 mL/hr over 60 Minutes Intravenous To [facility] 01/27/16 0747 01/27/16 1554       Radiology Studies: No results found.      Scheduled Meds: . sodium chloride   Intravenous Once  . docusate sodium  100 mg Oral BID  . enoxaparin (LOVENOX) injection  40 mg Subcutaneous Q24H  . feeding supplement (ENSURE ENLIVE)  237 mL Oral BID BM   Continuous Infusions:     LOS: 4 days    Time spent: 20 min    Marvion Bastidas, MD [facility] Pager [phone]  If 7PM-7AM, please contact night-coverage [email or web address] Password [email or web address] 01/30/2016, 11:36 AM

## 2016-01-30 NOTE — Progress Notes (Signed)
   01/30/16 1430  Clinical Encounter Type  Visited With Patient  Visit Type Follow-up  Spiritual Encounters  Spiritual Needs Emotional  Stress Factors  Patient Stress Factors Not reviewed  Family Stress Factors Not reviewed  Chaplain visited patient on afternoon rounds.  Patient is a lovely woman with a positive attitude.  Patient shared she would be traveling to a rehabilitation facility in [place] in the morning.

## 2016-01-30 NOTE — Progress Notes (Signed)
   Assessment/Plan: 3 Days Post-Op   S/P Left hip IM Nail by Dr. Ernesta Amble. [name] on 01/27/16   Principal Problem:   Intertrochanteric fracture of left hip (HCC) Active Problems:   Essential hypertension   Generalized anxiety disorder   Multiple thyroid nodules   Encephalomalacia on imaging study   Closed left hip fracture (HCC)   Malnutrition of moderate degree Acute Blood loss anemia    PLAN: Up with therapy  Weight Bearing: Weight Bearing as Tolerated (WBAT) Left Leg Dressings: prn VTE prophylaxis: Lovenox then ASA 325 after d/c for 30 days Dispo: D/C to SNF per primary  Subjective: Continued improvement.  Patient reports pain controlled.  Nicole Soto, drinking.  +Flatus/BM.    Objective:   VITALS:   Filed Vitals:   01/29/16 1434 01/29/16 1700 01/29/16 2051 01/30/16 0529  BP: 98/52 97/52 146/68 125/67  Pulse: 64 64 97 87  Temp: 98 F (36.7 C) 98 F (36.7 C) 98.2 F (36.8 C) 98.4 F (36.9 C)  TempSrc: Oral Oral Oral Oral  Resp: 16 16 16 16   Height:      Weight:      SpO2: 99% 99% 97% 98%    Lab Results  Component Value Date   WBC 13.1* 01/29/2016   HGB 8.1* 01/29/2016   HCT 24.1* 01/29/2016   MCV 88.6 01/29/2016   PLT 163 01/29/2016   BMET    Component Value Date/Time   NA 142 01/29/2016 0450   K 3.9 01/29/2016 0450   CL 111 01/29/2016 0450   CO2 25 01/29/2016 0450   GLUCOSE 90 01/29/2016 0450   BUN 18 01/29/2016 0450   CREATININE 0.63 01/29/2016 0450   CALCIUM 8.3* 01/29/2016 0450   GFRNONAA >60 01/29/2016 0450   GFRAA >60 01/29/2016 0450     Physical Exam General: NAD.  Supine in bed.  Neurologically intact Neurovascular intact Sensation intact distally Intact pulses distally Dorsiflexion/Plantar flexion intact Incision: dressing C/D/I   Nicole Soto III 01/30/2016, 6:16 AM

## 2016-01-30 NOTE — Progress Notes (Signed)
Pt assisted up to [facility]. Pt noted to have a red "abrasion like" area of L buttock. Pt states, "that's where I fell in the ditch". No other redness noted of sacral area. Pink protocol sticker applied. Pt encouraged to turn self to prevent further skin breakdown. Pt verb understanding and states, "I will try but my hip hurts". Pt noted also to have bilateral arm abrasions. No pressure areas noted of heels. Will continue to monitor.

## 2016-01-30 NOTE — Progress Notes (Signed)
Physical Therapy Treatment Patient Details Name: Nicole Soto MRN: [id number] DOB: [birth year] Today's Date: 01/30/2016    History of Present Illness Nicole Soto is an 65 y.o. female with a PMH of stable HTN and anxiety admitted s/p fall [place] at night with L hip fx, now s/p IM nail.    PT Comments    Pt progressing towards physical therapy goals. Pt managed anxiety better this session and appeared to be more successful when given the opportunity to set her own goals for the session and time to initiate mobility on her own. Will continue to follow and progress as able per POC.   Follow Up Recommendations  CIR     Equipment Recommendations  None recommended by PT    Recommendations for Other Services Rehab consult     Precautions / Restrictions Precautions Precautions: Fall Restrictions Weight Bearing Restrictions: Yes LLE Weight Bearing: Weight bearing as tolerated    Mobility  Bed Mobility Overal bed mobility: Needs Assistance Bed Mobility: Sit to Supine       Sit to supine: Min assist   General bed mobility comments: Pt motivated to elevate LE's onto bed without assist. Ended up requiring assistance for LLE onto bed. Pt was able to scoot to [facility] with use of trapeze bar  Transfers Overall transfer level: Needs assistance Equipment used: Rolling walker (2 wheeled) Transfers: Sit to/from Stand Sit to Stand: Min guard         General transfer comment: Increased time to power-up to full standing. Pt motivated to complete without assistance.  Ambulation/Gait Ambulation/Gait assistance: Min guard Ambulation Distance (Feet): 100 Feet Assistive device: Rolling walker (2 wheeled) Gait Pattern/deviations: Step-through pattern;Decreased stride length;Trunk flexed Gait velocity: decreased Gait velocity interpretation: Below normal speed for age/gender General Gait Details: VC's for sequencing and technique. Due to self-motivation yet increased anxiety  this session, limited cues to keep from overwhelming pt.    Stairs            Wheelchair Mobility    Modified Rankin (Stroke Patients Only)       Balance Overall balance assessment: Needs assistance Sitting-balance support: Feet supported;No upper extremity supported Sitting balance-Leahy Scale: Fair     Standing balance support: During functional activity;Bilateral upper extremity supported Standing balance-Leahy Scale: Poor                      Cognition Arousal/Alertness: Awake/alert Behavior During Therapy: Anxious Overall Cognitive Status: Within Functional Limits for tasks assessed                      Exercises      General Comments        Pertinent Vitals/Pain Pain Assessment: 0-10 Pain Score: 7  Pain Location: L hip with movement and R shoulder (sore all over) Pain Descriptors / Indicators: Operative site guarding;Sore Pain Intervention(s): Limited activity within patient's tolerance;Monitored during session;Repositioned    Home Living                      Prior Function            PT Goals (current goals can now be found in the care plan section) Acute Rehab PT Goals Patient Stated Goal: none stated PT Goal Formulation: With patient Time For Goal Achievement: 02/04/16 Potential to Achieve Goals: Good Progress towards PT goals: Progressing toward goals    Frequency  Min 4X/week    PT Plan Current plan remains  appropriate    Co-evaluation             End of Session Equipment Utilized During Treatment: Gait belt Activity Tolerance: Patient limited by pain Patient left: in chair;with call bell/phone within reach;with nursing/sitter in room     Time: 1139-1217 PT Time Calculation (min) (ACUTE ONLY): 38 min  Charges:  $Gait Training: 23-37 mins $Therapeutic Activity: 8-22 mins                    G Codes:      Rolinda Roan 02/18/2016, 1:27 PM   Rolinda Roan, PT, DPT Acute Rehabilitation  Services Pager: [phone]

## 2016-01-31 DIAGNOSIS — S72142A Displaced intertrochanteric fracture of left femur, initial encounter for closed fracture: Secondary | ICD-10-CM | POA: Diagnosis not present

## 2016-01-31 DIAGNOSIS — S72002S Fracture of unspecified part of neck of left femur, sequela: Secondary | ICD-10-CM | POA: Diagnosis not present

## 2016-01-31 DIAGNOSIS — I10 Essential (primary) hypertension: Secondary | ICD-10-CM | POA: Diagnosis not present

## 2016-01-31 DIAGNOSIS — S72145D Nondisplaced intertrochanteric fracture of left femur, subsequent encounter for closed fracture with routine healing: Secondary | ICD-10-CM | POA: Diagnosis not present

## 2016-01-31 DIAGNOSIS — Z4789 Encounter for other orthopedic aftercare: Secondary | ICD-10-CM | POA: Diagnosis not present

## 2016-01-31 DIAGNOSIS — R279 Unspecified lack of coordination: Secondary | ICD-10-CM | POA: Diagnosis not present

## 2016-01-31 DIAGNOSIS — R262 Difficulty in walking, not elsewhere classified: Secondary | ICD-10-CM | POA: Diagnosis not present

## 2016-01-31 DIAGNOSIS — F418 Other specified anxiety disorders: Secondary | ICD-10-CM | POA: Diagnosis not present

## 2016-01-31 DIAGNOSIS — Z9181 History of falling: Secondary | ICD-10-CM | POA: Diagnosis not present

## 2016-01-31 LAB — CBC
HCT: 30.3 % — ABNORMAL LOW (ref 36.0–46.0)
HEMOGLOBIN: 9.8 g/dL — AB (ref 12.0–15.0)
MCH: 29 pg (ref 26.0–34.0)
MCHC: 32.3 g/dL (ref 30.0–36.0)
MCV: 89.6 fL (ref 78.0–100.0)
Platelets: 218 10*3/uL (ref 150–400)
RBC: 3.38 MIL/uL — ABNORMAL LOW (ref 3.87–5.11)
RDW: 16.9 % — ABNORMAL HIGH (ref 11.5–15.5)
WBC: 10.8 10*3/uL — ABNORMAL HIGH (ref 4.0–10.5)

## 2016-01-31 MED ORDER — POLYETHYLENE GLYCOL 3350 17 G PO PACK: 17.0000 g | PACK | Freq: Every day | ORAL | Status: DC | PRN

## 2016-01-31 MED ORDER — ALPRAZOLAM 0.5 MG PO TABS: 0.5000 mg | ORAL_TABLET | Freq: Three times a day (TID) | ORAL | Status: AC

## 2016-01-31 MED ORDER — ENSURE ENLIVE PO LIQD: 237.0000 mL | Freq: Two times a day (BID) | ORAL | Status: AC

## 2016-01-31 NOTE — Discharge Summary (Signed)
Nicole Soto, is a 65 y.o. female  DOB [birth year]  MRN [id number].  Admission date:  01/26/2016  Admitting Physician  Maren Reamer, MD  Discharge Date:  01/31/2016   Primary MD  Neale Burly, MD  Recommendations for primary care physician for things to follow:  - Please check CBC, BMP in 3 days - Follow with orthopedic as an outpatient - Patient will need workup for multiple thyroid nodules as an outpatient, will need thyroid ultrasound to better characterize the thyroid nodules.    Admission Diagnosis  HIP FX. LT LEFT HIP FRACTURE   Discharge Diagnosis  HIP FX. LT LEFT HIP FRACTURE   Principal Problem:   Intertrochanteric fracture of left hip (HCC) Active Problems:   Essential hypertension   Generalized anxiety disorder   Multiple thyroid nodules   Encephalomalacia on imaging study   Closed left hip fracture (HCC)   Malnutrition of moderate degree      Past Medical History  Diagnosis Date  . Hypertension   . Anxiety   . Hepatitis A   . DDD (degenerative disc disease), cervical     Past Surgical History  Procedure Laterality Date  . Bladder surgery    . Femur im nail Left 01/27/2016    Procedure: INTRAMEDULLARY (IM) NAIL FEMORAL ;  Surgeon: Renette Butters, MD;  Location: [place];  Service: Orthopedics;  Laterality: Left;       History of present illness and  Hospital Course:     Kindly see H&P for history of present illness and admission details, please review complete Labs, Consult reports and Test reports for all details in brief  HPI  from the history and physical done on the day of admission 01/26/2016 Nicole Soto is an 65 y.o. female with a PMH of stable HTN managed with HCTZ 12.5 mg daily and Benazepril 20 mg daily and anxiety managed with Xanax 1mg  TID who tells me she went outside last night at 2 a.m. to feed her dogs, and slipped while walking on a wet, grassy  hill. She says she fell into a hole that came up to her shoulder and she was unable to get out on her own. She denies any LOC or head injury. She had terrible left hip pain and right shoulder pain after the fall. She was found by a neighbor 5 hours later. The neighbor called the ambulance, and the patient was brought to [facility] for further evaluation where she was found to have a left proximal comminuted intertrochanteric femur fracture. Her left hip pain has eased off with IV pain medications, but still rates it as an "eight and a half". Movement makes the pain worse, lying still eases it off. Labs done at [facility] reviewed and are notable for a WBC of 16.5, creatinine 0.94, hemoglobin 9.8 and CK 2620. UDS/alcohol level negative. The patient smokes 3 1/2 to 5 cigarettes a day. She lives alone with 2 dogs. The patient has never been married, and is estranged from her daughter.   ED  Course: Given IV morphine for pain control, arranged for transfer to [facility] for orthopedic consultation with Dr. Wonda Olds Course   Nicole Soto is an 65 y.o. female with a PMH of stable HTN and anxiety, presents with a full She denies any LOC or head injury. She had terrible left hip pain and right shoulder pain after the fall. She was found by a neighbor 5 hours later. The neighbor called the ambulance, and the patient was brought to [facility] for further evaluation where she was found to have a left proximal comminuted intertrochanteric femur fracture. She had surgery on 6/19 , required multiple PRBC transfusion for postoperative anemia.   Intertrochanteric fracture of left hip [facility]) - s/p surgery 6/19 by Dr. Edmonia Lynch - Seen by PT, will need SNF placement,  - Continue when necessary pain medicine  Anemia - Postoperative anemia, received total 5 units PRBC transfusion postoperatively, 3 units on 6/19, 2 units on 6/21, hemoglobin remained stable posttransfusion ,   Essential  hypertension -  continue  lisinopril and HCTZ   Generalized anxiety disorder - Continue Xanax, dose was lowered  Multiple thyroid nodules - Further workup as an outpatient ,Will need thyroid ultrasound at some point to characterize further - TSH ok  Discharge Condition:  stable   Follow UP  Follow-up Information    Follow up with Renette Butters, MD.   Specialty:  Orthopedic Surgery   Contact information:   [address] [place] [address] [phone]       Schedule an appointment as soon as possible for a visit with Neale Burly, MD.   Specialty:  Internal Medicine   Contact information:   [place] [id number] [phone]         Discharge Instructions  and  Discharge Medications     Discharge Instructions    Discharge instructions    Complete by:  As directed   Follow with Primary MD Neale Burly, MD after discharge from SNF  Get CBC, CMP,checked  by Primary MD next visit.    Activity:  as PT/OT recommendation with Full fall precautions use walker/cane & assistance as needed   Disposition SNF   Diet: Heart Healthy ** , with feeding assistance and aspiration precautions.  For Heart failure patients - Check your Weight same time everyday, if you gain over 2 pounds, or you develop in leg swelling, experience more shortness of breath or chest pain, call your Primary MD immediately. Follow Cardiac Low Salt Diet and 1.5 lit/day fluid restriction.   On your next visit with your primary care physician please Get Medicines reviewed and adjusted.   Please request your Prim.MD to go over all Hospital Tests and Procedure/Radiological results at the follow up, please get all Hospital records sent to your Prim MD by signing hospital release before you go home.   If you experience worsening of your admission symptoms, develop shortness of breath, life threatening emergency, suicidal or homicidal thoughts you must seek  medical attention immediately by calling 911 or calling your MD immediately  if symptoms less severe.  You Must read complete instructions/literature along with all the possible adverse reactions/side effects for all the Medicines you take and that have been prescribed to you. Take any new Medicines after you have completely understood and accpet all the possible adverse reactions/side effects.   Do not drive, operating heavy machinery, perform activities at heights, swimming or participation in water activities or  provide baby sitting services if your were admitted for syncope or siezures until you have seen by Primary MD or a Neurologist and advised to do so again.  Do not drive when taking Pain medications.    Do not take more than prescribed Pain, Sleep and Anxiety Medications  Special Instructions: If you have smoked or chewed Tobacco  in the last 2 yrs please stop smoking, stop any regular Alcohol  and or any Recreational drug use.  Wear Seat belts while driving.   Please note  You were cared for by a hospitalist during your hospital stay. If you have any questions about your discharge medications or the care you received while you were in the hospital after you are discharged, you can call the unit and asked to speak with the hospitalist on call if the hospitalist that took care of you is not available. Once you are discharged, your primary care physician will handle any further medical issues. Please note that NO REFILLS for any discharge medications will be authorized once you are discharged, as it is imperative that you return to your primary care physician (or establish a relationship with a primary care physician if you do not have one) for your aftercare needs so that they can reassess your need for medications and monitor your lab values.     Increase activity slowly    Complete by:  As directed             Medication List    STOP taking these medications        POTASSIUM  PO      TAKE these medications        ALPRAZolam 0.5 MG tablet  Commonly known as:  XANAX  Take 1 tablet (0.5 mg total) by mouth 3 (three) times daily.     aspirin EC 325 MG tablet  Take 1 tablet (325 mg total) by mouth daily.     b complex vitamins tablet  Take 1 tablet by mouth daily.     benazepril 20 MG tablet  Commonly known as:  LOTENSIN  Take 20 mg by mouth daily.     feeding supplement (ENSURE ENLIVE) Liqd  Take 237 mLs by mouth 2 (two) times daily between meals.     hydrochlorothiazide 12.5 MG capsule  Commonly known as:  MICROZIDE  Take 12.5 mg by mouth daily.     multivitamin tablet  Take 1 tablet by mouth daily.     ondansetron 4 MG tablet  Commonly known as:  ZOFRAN  Take 1 tablet (4 mg total) by mouth every 8 (eight) hours as needed for nausea or vomiting.     oxyCODONE-acetaminophen 5-325 MG tablet  Commonly known as:  ROXICET  Take 1-2 tablets by mouth every 4 (four) hours as needed for severe pain.     polyethylene glycol packet  Commonly known as:  MIRALAX / GLYCOLAX  Take 17 g by mouth daily as needed for mild constipation.          Diet and Activity recommendation: See Discharge Instructions above   Consults obtained -  Orthopedic   Major procedures and Radiology Reports - PLEASE review detailed and final reports for all details, in brief -  INTRAMEDULLARY (IM) NAIL FEMORAL  - 3 units PRBC transfusion on 6/19 - 2 units PRBC transfusion on 6/21   Pelvis Portable  01/27/2016  CLINICAL DATA:  Post nailing LEFT hip EXAM: PORTABLE PELVIS 1-2 VIEWS COMPARISON:  Intraoperative images 01/27/2016, prior radiographs 01/26/2016  FINDINGS: IM nail and compression screw within proximal LEFT femur post ORIF of an intertrochanteric fracture. Distal locking screw present. No dislocation on single AP projection. Visualized pelvis intact. Remainder of LEFT femur intact. Bones demineralized. Scattered soft tissues gas and expected postsurgical changes.  IMPRESSION: Post nailing and ORIF of an intertrochanteric fracture LEFT femur. No acute complications radiographically evident. Electronically Signed   By: Lavonia Dana M.D.   On: 01/27/2016 19:01   Dg C-arm 61-120 Min  01/27/2016  CLINICAL DATA:  Imaging obtained during ORIF of a left intertrochanteric proximal femur fracture. EXAM: DG C-ARM 61-120 MIN; LEFT FEMUR 2 VIEWS COMPARISON:  6/18/7 FINDINGS: For submitted images show placement of an intra medullary rod and a compression screw reducing the major intertrochanteric fracture components into near anatomic alignment. There is no new fracture or evidence of an operative complication. The orthopedic hardware appears well seated. IMPRESSION: Well aligned major fracture components following ORIF of a left proximal femur intertrochanteric fracture. Electronically Signed   By: Lajean Manes M.D.   On: 01/27/2016 17:01   Dg Femur Min 2 Views Left  01/27/2016  CLINICAL DATA:  Imaging obtained during ORIF of a left intertrochanteric proximal femur fracture. EXAM: DG C-ARM 61-120 MIN; LEFT FEMUR 2 VIEWS COMPARISON:  6/18/7 FINDINGS: For submitted images show placement of an intra medullary rod and a compression screw reducing the major intertrochanteric fracture components into near anatomic alignment. There is no new fracture or evidence of an operative complication. The orthopedic hardware appears well seated. IMPRESSION: Well aligned major fracture components following ORIF of a left proximal femur intertrochanteric fracture. Electronically Signed   By: Lajean Manes M.D.   On: 01/27/2016 17:01   Dg Outside Films Body  01/27/2016  This examination belongs to an outside facility and is stored here for comparison purposes only.  Contact the originating outside institution for any associated report or interpretation.   Micro Results     Recent Results (from the past 240 hour(s))  Surgical PCR screen     Status: None   Collection Time: 01/26/16  9:05 PM    Result Value Ref Range Status   MRSA, PCR NEGATIVE NEGATIVE Final   Staphylococcus aureus NEGATIVE NEGATIVE Final    Comment:        The Xpert SA Assay (FDA approved for NASAL specimens in patients over 39 years of age), is one component of a comprehensive surveillance program.  Test performance has been validated by [facility] for patients greater than or equal to 88 year old. It is not intended to diagnose infection nor to guide or monitor treatment.        Today   Subjective:   Jessamyn Letson today has no headache,no chest or abdominal pain,no new weakness tingling or numbness, feels much better wants to go home today.   Objective:   Blood pressure 141/69, pulse 85, temperature 98.5 F (36.9 C), temperature source Oral, resp. rate 16, height 5\' 4"  (1.626 m), weight 50.803 kg (112 lb), SpO2 100 %.   Intake/Output Summary (Last 24 hours) at 01/31/16 1203 Last data filed at 01/31/16 0900  Gross per 24 hour  Intake   1200 ml  Output    950 ml  Net    250 ml    Exam Awake Alert, Oriented x 3, No new F.N deficits, Normal affect Roberts.AT,PERRAL Supple Neck,No JVD, Symmetrical Chest wall movement, Good air movement bilaterally, CTAB RRR,No Gallops,Rubs or new Murmurs, No Parasternal Heave +ve B.Sounds, Abd Soft, Non tender,  No organomegaly appriciated, No rebound -guarding or rigidity. No Cyanosis, Clubbing or edema, No new Rash or bruise  Data Review   CBC w Diff: Lab Results  Component Value Date   WBC 10.8* 01/31/2016   HGB 9.8* 01/31/2016   HCT 30.3* 01/31/2016   PLT 218 01/31/2016    CMP: Lab Results  Component Value Date   NA 142 01/29/2016   K 3.9 01/29/2016   CL 111 01/29/2016   CO2 25 01/29/2016   BUN 18 01/29/2016   CREATININE 0.63 01/29/2016  .   Total Time in preparing paper work, data evaluation and todays exam - 35 minutes  Ch[place]wuebuka Churchill M.D on 01/31/2016 at 12:03 PM  [facility]   Office  [phone]

## 2016-01-31 NOTE — Progress Notes (Signed)
   Assessment/Plan: 4 Days Post-Op   S/P Left hip IM Nail by Dr. Ernesta Amble. [name] on 01/27/16   Principal Problem:   Intertrochanteric fracture of left hip (HCC) Active Problems:   Essential hypertension   Generalized anxiety disorder   Multiple thyroid nodules   Encephalomalacia on imaging study   Closed left hip fracture (HCC)   Malnutrition of moderate degree Acute Blood loss anemia - stable    PLAN: Up with therapy Weight Bearing: Weight Bearing as Tolerated (WBAT) Left Leg Dressings: Clean, dry dressings prn VTE prophylaxis: Lovenox then ASA 325 after d/c for 30 days Dispo: D/C to SNF   Subjective: Feeling well.  Patient reports pain controlled.    Objective:   VITALS:   Filed Vitals:   01/30/16 0529 01/30/16 1336 01/30/16 1948 01/31/16 0447  BP: 125/67 129/66 128/58 141/69  Pulse: 87 92 103 85  Temp: 98.4 F (36.9 C) 99 F (37.2 C) 98.9 F (37.2 C) 98.5 F (36.9 C)  TempSrc: Oral Oral Oral Oral  Resp: 16 17 16 16   Height:      Weight:      SpO2: 98% 98% 97% 100%    Lab Results  Component Value Date   WBC 10.8* 01/31/2016   HGB 9.8* 01/31/2016   HCT 30.3* 01/31/2016   MCV 89.6 01/31/2016   PLT 218 01/31/2016   BMET    Component Value Date/Time   NA 142 01/29/2016 0450   K 3.9 01/29/2016 0450   CL 111 01/29/2016 0450   CO2 25 01/29/2016 0450   GLUCOSE 90 01/29/2016 0450   BUN 18 01/29/2016 0450   CREATININE 0.63 01/29/2016 0450   CALCIUM 8.3* 01/29/2016 0450   GFRNONAA >60 01/29/2016 0450   GFRAA >60 01/29/2016 0450     Physical Exam General: NAD.  Upright in chair  Neurologically intact Neurovascular intact Sensation intact distally Intact pulses distally Dorsiflexion/Plantar flexion intact Incision: dressing C/D/I   Prudencio Burly III 01/31/2016, 8:33 AM

## 2016-01-31 NOTE — Care Management Important Message (Signed)
Important Message  Patient Details  Name: Nicole Soto MRN: [id number] Date of Birth: 1950/09/30   Medicare Important Message Given:  Yes    Loann Quill 01/31/2016, 9:53 AM

## 2016-01-31 NOTE — Clinical Social Work Placement (Signed)
   CLINICAL SOCIAL WORK PLACEMENT  NOTE  Date:  01/31/2016  Patient Details  Name: Nicole Soto MRN: [id number] Date of Birth: Dec 06, 1950  Clinical Social Work is seeking post-discharge placement for this patient at the [place] level of care (*CSW will initial, date and re-position this form in  chart as items are completed):  Yes   Patient/family provided with [facility] Work Department's list of facilities offering this level of care within the geographic area requested by the patient (or if unable, by the patient's family).  Yes   Patient/family informed of their freedom to choose among providers that offer the needed level of care, that participate in Medicare, Medicaid or managed care program needed by the patient, have an available bed and are willing to accept the patient.  Yes   Patient/family informed of [facility]'s ownership interest in [facility] and [facility], as well as of the fact that they are under no obligation to receive care at these facilities.  PASRR submitted to EDS on  ([place])     PASRR number received on  ([place])     Existing PASRR number confirmed on       FL2 transmitted to all facilities in geographic area requested by pt/family on 01/29/16     FL2 transmitted to all facilities within larger geographic area on       Patient informed that his/her managed care company has contracts with or will negotiate with certain facilities, including the following:        Yes   Patient/family informed of bed offers received.  Patient chooses bed at [facility] and [facility]     Physician recommends and patient chooses bed at      Patient to be transferred to [facility] on 01/31/16.  Patient to be transferred to facility by [facility]     Patient family notified on 01/31/16 of transfer.  Name of family member notified:  Patient     PHYSICIAN Please sign FL2     Additional Comment:     _______________________________________________ Caroline Sauger, LCSW 01/31/2016, 12:17 PM

## 2016-01-31 NOTE — Progress Notes (Signed)
Occupational Therapy Treatment Patient Details Name: Nicole Soto MRN: [id number] DOB: [birth year] Today's Date: 01/31/2016    History of present illness Nicole Soto is an 65 y.o. female with a PMH of stable HTN and anxiety admitted s/p fall outside on grassy hill at night with L hip fx, now s/p IM nail.   OT comments  Pt. Awake and agreeable to participation in skilled OT.  Able to amb. To/from b.room and complete toileting with min guard a.  D/c later today for short term snf.  Reports she has all dme when ready for home and has a friend that will be staying with her.   Follow Up Recommendations       Equipment Recommendations       Recommendations for Other Services      Precautions / Restrictions Precautions Precautions: Fall Restrictions LLE Weight Bearing: Weight bearing as tolerated       Mobility Bed Mobility               General bed mobility comments: in recliner upon arrival into room  Transfers Overall transfer level: Needs assistance Equipment used: Rolling walker (2 wheeled) Transfers: Sit to/from [facility] Sit to Stand: Min guard Stand pivot transfers: Min guard            Balance                                   ADL Overall ADL's : Needs assistance/impaired                         Toilet Transfer: Min guard;Adhering to hip precautions;Cueing for sequencing;Ambulation;BSC;Regular Toilet;Grab bars;RW   Toileting- Clothing Manipulation and Hygiene: Sitting/lateral lean       Functional mobility during ADLs: Min guard General ADL Comments: min guard a, doing better with inst. for transfer tech. continued to say "hold on im supposed to stop talking and focus on what i am doing"      Vision                     Perception     Praxis      Cognition   Behavior During Therapy: [facility] for tasks assessed/performed Overall Cognitive Status: Within Functional Limits for tasks assessed                       Extremity/Trunk Assessment               Exercises     Shoulder Instructions       General Comments      Pertinent Vitals/ Pain       Pain Assessment: No/denies pain  Home Living                                          Prior Functioning/Environment              Frequency Min 2X/week     Progress Toward Goals  OT Goals(current goals can now be found in the care plan section)  Progress towards OT goals: Progressing toward goals     Plan Discharge plan remains appropriate    Co-evaluation                 End of Session  Equipment Utilized During Treatment: Gait belt;Rolling walker   Activity Tolerance Patient tolerated treatment well   Patient Left in chair;with call bell/phone within reach   Nurse Communication          Time: [id number] OT Time Calculation (min): 12 min  Charges: OT General Charges $OT Visit: 1 Procedure OT Treatments $Self Care/Home Management : 8-22 mins  Janice Coffin, COTA/L 01/31/2016, 1:31 PM

## 2016-01-31 NOTE — Discharge Instructions (Signed)
Follow with Primary MD Neale Burly, MD after discharge from SNF  Get CBC, CMP,checked  by Primary MD next visit.    Activity:  as PT/OT recommendation with Full fall precautions use walker/cane & assistance as needed   Disposition SNF   Diet: Heart Healthy ** , with feeding assistance and aspiration precautions.  For Heart failure patients - Check your Weight same time everyday, if you gain over 2 pounds, or you develop in leg swelling, experience more shortness of breath or chest pain, call your Primary MD immediately. Follow Cardiac Low Salt Diet and 1.5 lit/day fluid restriction.   On your next visit with your primary care physician please Get Medicines reviewed and adjusted.   Please request your Prim.MD to go over all Hospital Tests and Procedure/Radiological results at the follow up, please get all Hospital records sent to your Prim MD by signing hospital release before you go home.   If you experience worsening of your admission symptoms, develop shortness of breath, life threatening emergency, suicidal or homicidal thoughts you must seek medical attention immediately by calling 911 or calling your MD immediately  if symptoms less severe.  You Must read complete instructions/literature along with all the possible adverse reactions/side effects for all the Medicines you take and that have been prescribed to you. Take any new Medicines after you have completely understood and accpet all the possible adverse reactions/side effects.   Do not drive, operating heavy machinery, perform activities at heights, swimming or participation in water activities or provide baby sitting services if your were admitted for syncope or siezures until you have seen by Primary MD or a Neurologist and advised to do so again.  Do not drive when taking Pain medications.    Do not take more than prescribed Pain, Sleep and Anxiety Medications  Special Instructions: If you have smoked or chewed Tobacco   in the last 2 yrs please stop smoking, stop any regular Alcohol  and or any Recreational drug use.  Wear Seat belts while driving.   Please note  You were cared for by a hospitalist during your hospital stay. If you have any questions about your discharge medications or the care you received while you were in the hospital after you are discharged, you can call the unit and asked to speak with the hospitalist on call if the hospitalist that took care of you is not available. Once you are discharged, your primary care physician will handle any further medical issues. Please note that NO REFILLS for any discharge medications will be authorized once you are discharged, as it is imperative that you return to your primary care physician (or establish a relationship with a primary care physician if you do not have one) for your aftercare needs so that they can reassess your need for medications and monitor your lab values.

## 2016-01-31 NOTE — Progress Notes (Signed)
PT Cancellation Note  Patient Details Name: Adream Delich MRN: [id number] DOB: 24-Apr-1951   Cancelled Treatment:    Reason Eval/Treat Not Completed: When PT arrived transport was present for transfer to SNF. Please see last PT note for current level of functional mobility.   Rolinda Roan 01/31/2016, 1:27 PM   Rolinda Roan, PT, DPT Acute Rehabilitation Services Pager: [phone]

## 2016-01-31 NOTE — Clinical Social Work Note (Signed)
Patient to be discharged to [facility]. Patient to be transported via EMS (1pm per RN). RN report number: [id number]  Lubertha Sayres, LCSW [phone] Orthopedics: [phone] Surgical: [phone]

## 2016-01-31 NOTE — Progress Notes (Signed)
Report give to LPN. Elsie Saas at [facility]. Transportation personal took pt's prescriptions with pt to facility. Pt pain is managed well before she go.

## 2016-02-03 DIAGNOSIS — I10 Essential (primary) hypertension: Secondary | ICD-10-CM | POA: Diagnosis not present

## 2016-02-03 DIAGNOSIS — S72002S Fracture of unspecified part of neck of left femur, sequela: Secondary | ICD-10-CM | POA: Diagnosis not present

## 2016-02-03 DIAGNOSIS — F418 Other specified anxiety disorders: Secondary | ICD-10-CM | POA: Diagnosis not present

## 2016-02-08 DIAGNOSIS — S72002S Fracture of unspecified part of neck of left femur, sequela: Secondary | ICD-10-CM | POA: Diagnosis not present

## 2016-02-08 DIAGNOSIS — I10 Essential (primary) hypertension: Secondary | ICD-10-CM | POA: Diagnosis not present

## 2016-02-08 DIAGNOSIS — F418 Other specified anxiety disorders: Secondary | ICD-10-CM | POA: Diagnosis not present

## 2016-08-05 ENCOUNTER — Inpatient Hospital Stay (HOSPITAL_COMMUNITY)
Admission: AD | Admit: 2016-08-05 | Discharge: 2016-08-09 | DRG: 469 | Disposition: A | Discharge status: Home Health | Payer: Medicare HMO | Source: Ambulatory Visit | Attending: Internal Medicine | Admitting: Internal Medicine

## 2016-08-05 ENCOUNTER — Inpatient Hospital Stay (HOSPITAL_COMMUNITY): Payer: Medicare HMO

## 2016-08-05 ENCOUNTER — Encounter (HOSPITAL_COMMUNITY): Payer: Self-pay

## 2016-08-05 ENCOUNTER — Encounter (HOSPITAL_COMMUNITY): Payer: Self-pay | Admitting: *Deleted

## 2016-08-05 DIAGNOSIS — D62 Acute posthemorrhagic anemia: Secondary | ICD-10-CM | POA: Diagnosis not present

## 2016-08-05 DIAGNOSIS — W19XXXA Unspecified fall, initial encounter: Secondary | ICD-10-CM

## 2016-08-05 DIAGNOSIS — K219 Gastro-esophageal reflux disease without esophagitis: Secondary | ICD-10-CM | POA: Diagnosis present

## 2016-08-05 DIAGNOSIS — F411 Generalized anxiety disorder: Secondary | ICD-10-CM | POA: Diagnosis present

## 2016-08-05 DIAGNOSIS — E440 Moderate protein-calorie malnutrition: Secondary | ICD-10-CM | POA: Diagnosis present

## 2016-08-05 DIAGNOSIS — Z681 Body mass index (BMI) 19 or less, adult: Secondary | ICD-10-CM

## 2016-08-05 DIAGNOSIS — M960 Pseudarthrosis after fusion or arthrodesis: Secondary | ICD-10-CM | POA: Diagnosis present

## 2016-08-05 DIAGNOSIS — J449 Chronic obstructive pulmonary disease, unspecified: Secondary | ICD-10-CM | POA: Diagnosis present

## 2016-08-05 DIAGNOSIS — Z79899 Other long term (current) drug therapy: Secondary | ICD-10-CM | POA: Diagnosis not present

## 2016-08-05 DIAGNOSIS — F17210 Nicotine dependence, cigarettes, uncomplicated: Secondary | ICD-10-CM | POA: Diagnosis present

## 2016-08-05 DIAGNOSIS — S72142K Displaced intertrochanteric fracture of left femur, subsequent encounter for closed fracture with nonunion: Secondary | ICD-10-CM

## 2016-08-05 DIAGNOSIS — M25552 Pain in left hip: Secondary | ICD-10-CM | POA: Diagnosis present

## 2016-08-05 DIAGNOSIS — B159 Hepatitis A without hepatic coma: Secondary | ICD-10-CM | POA: Diagnosis present

## 2016-08-05 DIAGNOSIS — Y838 Other surgical procedures as the cause of abnormal reaction of the patient, or of later complication, without mention of misadventure at the time of the procedure: Secondary | ICD-10-CM | POA: Diagnosis present

## 2016-08-05 DIAGNOSIS — D6859 Other primary thrombophilia: Secondary | ICD-10-CM | POA: Diagnosis present

## 2016-08-05 DIAGNOSIS — Z8781 Personal history of (healed) traumatic fracture: Secondary | ICD-10-CM

## 2016-08-05 DIAGNOSIS — R262 Difficulty in walking, not elsewhere classified: Secondary | ICD-10-CM

## 2016-08-05 DIAGNOSIS — Z7982 Long term (current) use of aspirin: Secondary | ICD-10-CM | POA: Diagnosis not present

## 2016-08-05 DIAGNOSIS — S72001D Fracture of unspecified part of neck of right femur, subsequent encounter for closed fracture with routine healing: Secondary | ICD-10-CM | POA: Diagnosis not present

## 2016-08-05 DIAGNOSIS — S72002A Fracture of unspecified part of neck of left femur, initial encounter for closed fracture: Secondary | ICD-10-CM | POA: Diagnosis present

## 2016-08-05 DIAGNOSIS — I10 Essential (primary) hypertension: Secondary | ICD-10-CM | POA: Diagnosis present

## 2016-08-05 DIAGNOSIS — S72009A Fracture of unspecified part of neck of unspecified femur, initial encounter for closed fracture: Secondary | ICD-10-CM | POA: Diagnosis present

## 2016-08-05 DIAGNOSIS — T84011A Broken internal left hip prosthesis, initial encounter: Secondary | ICD-10-CM | POA: Diagnosis present

## 2016-08-05 DIAGNOSIS — S72001A Fracture of unspecified part of neck of right femur, initial encounter for closed fracture: Secondary | ICD-10-CM | POA: Diagnosis not present

## 2016-08-05 DIAGNOSIS — R0602 Shortness of breath: Secondary | ICD-10-CM

## 2016-08-05 LAB — COMPREHENSIVE METABOLIC PANEL
ALT: 10 U/L — ABNORMAL LOW (ref 14–54)
AST: 17 U/L (ref 15–41)
Albumin: 3.4 g/dL — ABNORMAL LOW (ref 3.5–5.0)
Alkaline Phosphatase: 94 U/L (ref 38–126)
Anion gap: 6 (ref 5–15)
BILIRUBIN TOTAL: 0.4 mg/dL (ref 0.3–1.2)
BUN: 16 mg/dL (ref 6–20)
CO2: 26 mmol/L (ref 22–32)
CREATININE: 0.52 mg/dL (ref 0.44–1.00)
Calcium: 9.2 mg/dL (ref 8.9–10.3)
Chloride: 107 mmol/L (ref 101–111)
GFR calc Af Amer: >60 mL/min (ref >60)
Glucose, Bld: 78 mg/dL (ref 65–99)
POTASSIUM: 3.7 mmol/L (ref 3.5–5.1)
Sodium: 139 mmol/L (ref 135–145)
TOTAL PROTEIN: 6.7 g/dL (ref 6.5–8.1)

## 2016-08-05 LAB — CBC WITH DIFFERENTIAL/PLATELET
BASOS ABS: 0.1 10*3/uL (ref 0.0–0.1)
Basophils Relative: 1 %
Eosinophils Absolute: 0.3 10*3/uL (ref 0.0–0.7)
Eosinophils Relative: 3 %
HEMATOCRIT: 33.7 % — AB (ref 36.0–46.0)
Hemoglobin: 10.9 g/dL — ABNORMAL LOW (ref 12.0–15.0)
LYMPHS ABS: 3 10*3/uL (ref 0.7–4.0)
LYMPHS PCT: 29 %
MCH: 32.9 pg (ref 26.0–34.0)
MCHC: 32.3 g/dL (ref 30.0–36.0)
MCV: 101.8 fL — AB (ref 78.0–100.0)
MONO ABS: 0.8 10*3/uL (ref 0.1–1.0)
MONOS PCT: 7 %
NEUTROS ABS: 6.1 10*3/uL (ref 1.7–7.7)
Neutrophils Relative %: 60 %
Platelets: 495 10*3/uL — ABNORMAL HIGH (ref 150–400)
RBC: 3.31 MIL/uL — ABNORMAL LOW (ref 3.87–5.11)
RDW: 13.2 % (ref 11.5–15.5)
WBC: 10.1 10*3/uL (ref 4.0–10.5)

## 2016-08-05 LAB — PROTIME-INR: Prothrombin Time: >90 seconds — ABNORMAL HIGH (ref 11.4–15.2)

## 2016-08-05 LAB — SURGICAL PCR SCREEN
MRSA, PCR: NEGATIVE
Staphylococcus aureus: NEGATIVE

## 2016-08-05 LAB — APTT

## 2016-08-05 MED ORDER — B COMPLEX PO TABS: 1.0000 | ORAL_TABLET | Freq: Every day | ORAL | Status: DC

## 2016-08-05 MED ORDER — SODIUM CHLORIDE 0.9 % IV SOLN
INTRAVENOUS | Status: AC
  Administered 2016-08-05: 14:00:00 via INTRAVENOUS

## 2016-08-05 MED ORDER — ENSURE ENLIVE PO LIQD
237.0000 mL | Freq: Two times a day (BID) | ORAL | Status: DC
  Administered 2016-08-05 – 2016-08-09 (×7): 237 mL via ORAL

## 2016-08-05 MED ORDER — ALPRAZOLAM 0.5 MG PO TABS
0.5000 mg | ORAL_TABLET | Freq: Three times a day (TID) | ORAL | Status: DC
  Administered 2016-08-05 – 2016-08-06 (×3): 0.5 mg via ORAL
  Filled 2016-08-05 (×3): qty 1

## 2016-08-05 MED ORDER — ONDANSETRON HCL 4 MG PO TABS: 4.0000 mg | ORAL_TABLET | Freq: Three times a day (TID) | ORAL | Status: DC | PRN

## 2016-08-05 MED ORDER — METHOCARBAMOL 1000 MG/10ML IJ SOLN: 500.0000 mg | Freq: Four times a day (QID) | INTRAVENOUS | Status: DC | PRN

## 2016-08-05 MED ORDER — HEPARIN SODIUM (PORCINE) 5000 UNIT/ML IJ SOLN: 5000.0000 [IU] | Freq: Three times a day (TID) | INTRAMUSCULAR | Status: DC

## 2016-08-05 MED ORDER — PHYTONADIONE 5 MG PO TABS
5.0000 mg | ORAL_TABLET | Freq: Once | ORAL | Status: AC
  Administered 2016-08-05: 5 mg via ORAL
  Filled 2016-08-05: qty 1

## 2016-08-05 MED ORDER — MORPHINE SULFATE (PF) 2 MG/ML IV SOLN
0.5000 mg | INTRAVENOUS | Status: DC | PRN
  Administered 2016-08-06 – 2016-08-07 (×4): 0.5 mg via INTRAVENOUS
  Filled 2016-08-05 (×4): qty 1

## 2016-08-05 MED ORDER — HYDROCHLOROTHIAZIDE 12.5 MG PO CAPS
12.5000 mg | ORAL_CAPSULE | Freq: Every day | ORAL | Status: DC
  Filled 2016-08-05: qty 1

## 2016-08-05 MED ORDER — ASPIRIN EC 325 MG PO TBEC: 325.0000 mg | DELAYED_RELEASE_TABLET | Freq: Every day | ORAL | Status: DC

## 2016-08-05 MED ORDER — BENAZEPRIL HCL 20 MG PO TABS
20.0000 mg | ORAL_TABLET | Freq: Every day | ORAL | Status: DC
  Administered 2016-08-09: 20 mg via ORAL
  Filled 2016-08-05 (×4): qty 1

## 2016-08-05 MED ORDER — METHOCARBAMOL 500 MG PO TABS
500.0000 mg | ORAL_TABLET | Freq: Four times a day (QID) | ORAL | Status: DC | PRN
  Administered 2016-08-06 – 2016-08-09 (×7): 500 mg via ORAL
  Filled 2016-08-05 (×7): qty 1

## 2016-08-05 MED ORDER — DOCUSATE SODIUM 100 MG PO CAPS
100.0000 mg | ORAL_CAPSULE | Freq: Two times a day (BID) | ORAL | Status: DC
  Administered 2016-08-05 – 2016-08-09 (×7): 100 mg via ORAL
  Filled 2016-08-05 (×8): qty 1

## 2016-08-05 MED ORDER — HYDROCODONE-ACETAMINOPHEN 5-325 MG PO TABS
1.0000 | ORAL_TABLET | Freq: Four times a day (QID) | ORAL | Status: DC | PRN
  Administered 2016-08-05 (×2): 2 via ORAL
  Filled 2016-08-05 (×2): qty 2

## 2016-08-05 MED ORDER — OXYCODONE-ACETAMINOPHEN 5-325 MG PO TABS
1.0000 | ORAL_TABLET | ORAL | Status: DC | PRN
  Administered 2016-08-06 – 2016-08-09 (×16): 2 via ORAL
  Filled 2016-08-05 (×16): qty 2

## 2016-08-05 NOTE — Care Management Note (Signed)
Case Management Note  Patient Details  Name: Nicole Soto MRN: [id number] Date of Birth: 1951-03-18  Subjective/Objective: 65 y.o. F admitted for Nonunion of L Hip and Hardware Failure. Scheduled for surgery in am. CM will follow                   Action/Plan:   Expected Discharge Date:                  Expected Discharge Plan:     In-House Referral:     Discharge planning Services  CM Consult  Post Acute Care Choice:    Choice offered to:     DME Arranged:    DME Agency:     HH Arranged:    HH Agency:     Status of Service:  In process, will continue to follow  If discussed at Long Length of Stay Meetings, dates discussed:    Additional Comments:  [name], RN 08/05/2016, 2:52 PM

## 2016-08-05 NOTE — Consult Note (Signed)
ORTHOPAEDIC CONSULTATION  REQUESTING PHYSICIAN: Renette Butters, MD  Chief Complaint: Left hip nonunion and hardware fracture  HPI: Nicole Soto is a 65 y.o. female who complains of  Severe increase in pain in her L hip over the last week. X-ray done at outside hospital shows failure of her IM nail with loss of reduction  Past Medical History:  Diagnosis Date  . Anxiety   . DDD (degenerative disc disease), cervical   . Hepatitis A   . Hypertension    Past Surgical History:  Procedure Laterality Date  . BLADDER SURGERY    . FEMUR IM NAIL Left 01/27/2016   Procedure: INTRAMEDULLARY (IM) NAIL FEMORAL ;  Surgeon: Renette Butters, MD;  Location: [facility];  Service: Orthopedics;  Laterality: Left;   Social History   Social History  . Marital status: Single    Spouse name: N/A  . Number of children: 1  . Years of education: N/A   Occupational History  . Retired    Social History Main Topics  . Smoking status: Current Every Day Smoker  . Smokeless tobacco: Not on file  . Alcohol use 0.0 oz/week     Comment: Very little  . Drug use: No  . Sexual activity: Not on file   Other Topics Concern  . Not on file   Social History Narrative   he patient smokes 3 1/2 to 5 cigarettes a day.  She lives alone with 2 dogs. The patient has never been married, and is estranged from her daughter.     Family History  Problem Relation Age of Onset  . Heart attack Mother   . Heart attack Brother    Allergies  Allergen Reactions  . Penicillins     Syncope  . Tape Rash    M3 Tape   Prior to Admission medications   Medication Sig Start Date End Date Taking? Authorizing Provider  ALPRAZolam Duanne Moron) 0.5 MG tablet Take 1 tablet (0.5 mg total) by mouth 3 (three) times daily. 01/31/16   Albertine Patricia, MD  aspirin EC 325 MG tablet Take 1 tablet (325 mg total) by mouth daily. 01/27/16   Geradine Girt, DO  b complex vitamins tablet Take 1 tablet by mouth daily.    Historical Provider,  MD  benazepril (LOTENSIN) 20 MG tablet Take 20 mg by mouth daily. 01/20/16   Historical Provider, MD  feeding supplement, ENSURE ENLIVE, (ENSURE ENLIVE) LIQD Take 237 mLs by mouth 2 (two) times daily between meals. 01/31/16   Silver Huguenin Elgergawy, MD  hydrochlorothiazide (MICROZIDE) 12.5 MG capsule Take 12.5 mg by mouth daily. 01/20/16   Historical Provider, MD  Multiple Vitamin (MULTIVITAMIN) tablet Take 1 tablet by mouth daily.    Historical Provider, MD  ondansetron (ZOFRAN) 4 MG tablet Take 1 tablet (4 mg total) by mouth every 8 (eight) hours as needed for nausea or vomiting. 01/27/16   Geradine Girt, DO  oxyCODONE-acetaminophen (ROXICET) 5-325 MG tablet Take 1-2 tablets by mouth every 4 (four) hours as needed for severe pain. 01/27/16   Geradine Girt, DO  polyethylene glycol (MIRALAX / GLYCOLAX) packet Take 17 g by mouth daily as needed for mild constipation. 01/31/16   Albertine Patricia, MD   Chest Portable 1 View  Result Date: 08/05/2016 CLINICAL DATA:  Chest pain along the left side of the sternum status post fall EXAM: PORTABLE CHEST 1 VIEW COMPARISON:  None. FINDINGS: The heart size and mediastinal contours are within normal  limits. Both lungs are clear. The visualized skeletal structures are unremarkable. IMPRESSION: No active disease. Electronically Signed   By: Kathreen Devoid   On: 08/05/2016 12:09    Positive ROS: All other systems have been reviewed and were otherwise negative with the exception of those mentioned in the HPI and as above.  Labs cbc  Recent Labs  08/05/16 1142  WBC 10.1  HGB 10.9*  HCT 33.7*  PLT 495*    Labs inflam No results for input(s): CRP in the last 72 hours.  Invalid input(s): ESR  Labs coag No results for input(s): INR, PTT in the last 72 hours.  Invalid input(s): PT  No results for input(s): NA, K, CL, CO2, GLUCOSE, BUN, CREATININE, CALCIUM in the last 72 hours.  Physical Exam: There were no vitals filed for this visit. General: Alert, no  acute distress Cardiovascular: No pedal edema Respiratory: No cyanosis, no use of accessory musculature GI: No organomegaly, abdomen is soft and non-tender Skin: No lesions in the area of chief complaint other than those listed below in MSK exam.  Neurologic: Sensation intact distally save for the below mentioned MSK exam Psychiatric: Patient is competent for consent with normal mood and affect Lymphatic: No axillary or cervical lymphadenopathy  MUSCULOSKELETAL:  LLE: pain with ROM, NVI Other extremities are atraumatic with painless ROM and NVI.  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  Bedrest for now   Renette Butters, MD Cell [phone]   08/05/2016 12:15 PM

## 2016-08-05 NOTE — Progress Notes (Signed)
Stopped by to visit w/ pt, who greeted me warmly. She was then a "two-fisted drinker" w/ ensure and ginger ale, and was so excited to tell me how good God was, w/ all the blessings in her life right now -- esp. that after 20 yrs God is bringing her family back together -- esp. that her daughter for whom she had to cut the apron strings (making motions w/ her hand as if cutting w/ scissors) was now clean of drugs for 2 yrs, working in program helping others quit drugs, and graduated from college! We agreed that was worth a prayer of Thanksgiving, and were just through the [place] [facility] and almost the [facility] when her phone rang, and she felt she should take it in light of her surgery tomorrow. Will return after her surgery  to check on her.   N.B. for nurses: I didn't know why the little box on her right bedside table was beeping loudly so sporadically. As leaving, realized it was likely measuring oxygen utilization, and asked the administrative staff person. She said, yes, when it beeps, tell her to take a deep breath! When I said pt didn't seem to know/recall that, she said she'd let pt;'s nurse know. Thank you!    08/05/16 1500  Clinical Encounter Type  Visited With Patient  Visit Type Initial;Psychological support;Spiritual support;Social support;Pre-op  Referral From Chaplain  Spiritual Encounters  Spiritual Needs Prayer;Emotional  Stress Factors  Patient Stress Factors Health changes;Loss of control   Gerrit Heck, Chaplain

## 2016-08-05 NOTE — Progress Notes (Signed)
PCR swab obtained and forwarded to lab

## 2016-08-05 NOTE — Progress Notes (Signed)
Lab notified this RN of INR > 10 and PTT > 200 sec. Dyanne Carrel NP notified and returned call and will d/c today's scheduled sq heparin. Will continue to observe pt.

## 2016-08-05 NOTE — Progress Notes (Signed)
PHARMACIST - PHYSICIAN ORDER COMMUNICATION  CONCERNING: P&T Medication Policy on Herbal Medications  DESCRIPTION:  This patient's order for:  B complex  has been noted.  This product(s) is classified as an "herbal" or natural product. Due to a lack of definitive safety studies or FDA approval, nonstandard manufacturing practices, plus the potential risk of unknown drug-drug interactions while on inpatient medications, the Pharmacy and Therapeutics Committee does not permit the use of "herbal" or natural products of this type within [facility].   ACTION TAKEN: The pharmacy department is unable to verify this order at this time and your patient has been informed of this safety policy. Please reevaluate patient's clinical condition at discharge and address if the herbal or natural product(s) should be resumed at that time.

## 2016-08-05 NOTE — H&P (Signed)
History and Physical    Nicole Soto [id number] DOB: 09/16/1950 DOA: 08/05/2016  PCP: Neale Burly, MD Patient coming from: home/MD office  Chief Complaint: left hip pain  HPI: Nicole Soto is a 65 y.o. female with medical history significant hypertension, anxiety, tobacco use presents to 5 [address] room 97 from Dr. Debroah Loop office with chief complaint left hip pain. Recent scheduled for hip surgery tomorrow we are asked to admit  Information is obtained from the patient. She reports 6 months ago she fell and broke her left hip. It was replaced at that time. Then she reports the hip "failed" causing worsening pain and more unsteady gait. She fell again and re-broke the hip. Patient unable to bear weight or care for herself. She scheduled an appointment with orthopedic surgeon but pain worsened so an x-ray was obtained. She denies headache dizziness syncope or near-syncope. She denies abdominal pain nausea vomiting diarrhea constipation melena or bright red blood per rectum. She denies chest pain palpitations shortness of breath. She denies lower extremity edema orthopnea.   ED Course: Patient was a direct admit from Dr. Debroah Loop office  Review of Systems: As per HPI otherwise 10 point review of systems negative.   Ambulatory Status: He is been using a rolling walker with this care in  Past Medical History:  Diagnosis Date  . Anxiety   . DDD (degenerative disc disease), cervical   . Hepatitis A   . Hypertension     Past Surgical History:  Procedure Laterality Date  . BLADDER SURGERY    . FEMUR IM NAIL Left 01/27/2016   Procedure: INTRAMEDULLARY (IM) NAIL FEMORAL ;  Surgeon: Renette Butters, MD;  Location: [facility];  Service: Orthopedics;  Laterality: Left;    Social History   Social History  . Marital status: Single    Spouse name: N/A  . Number of children: 1  . Years of education: N/A   Occupational History  . Retired    Social History Main Topics  . Smoking status:  Current Every Day Smoker  . Smokeless tobacco: Not on file  . Alcohol use 0.0 oz/week     Comment: Very little  . Drug use: No  . Sexual activity: Not on file   Other Topics Concern  . Not on file   Social History Narrative   he patient smokes 3 1/2 to 5 cigarettes a day.  She lives alone with 2 dogs. The patient has never been married, and is estranged from her daughter.      Allergies  Allergen Reactions  . Penicillins Other (See Comments)    Syncope  . Shrimp [Shellfish Allergy] Anaphylaxis  . Tape Rash and Other (See Comments)    M3 Tape    Family History  Problem Relation Age of Onset  . Heart attack Mother   . Heart attack Brother     Prior to Admission medications   Medication Sig Start Date End Date Taking? Authorizing Provider  ALPRAZolam Duanne Moron) 0.5 MG tablet Take 1 tablet (0.5 mg total) by mouth 3 (three) times daily. 01/31/16   Albertine Patricia, MD  aspirin EC 325 MG tablet Take 1 tablet (325 mg total) by mouth daily. 01/27/16   Geradine Girt, DO  b complex vitamins tablet Take 1 tablet by mouth daily.    Historical Provider, MD  benazepril (LOTENSIN) 20 MG tablet Take 20 mg by mouth daily. 01/20/16   Historical Provider, MD  feeding supplement, ENSURE ENLIVE, (ENSURE ENLIVE) LIQD Take 237  mLs by mouth 2 (two) times daily between meals. 01/31/16   Silver Huguenin Elgergawy, MD  hydrochlorothiazide (MICROZIDE) 12.5 MG capsule Take 12.5 mg by mouth daily. 01/20/16   Historical Provider, MD  Multiple Vitamin (MULTIVITAMIN) tablet Take 1 tablet by mouth daily.    Historical Provider, MD  ondansetron (ZOFRAN) 4 MG tablet Take 1 tablet (4 mg total) by mouth every 8 (eight) hours as needed for nausea or vomiting. 01/27/16   Geradine Girt, DO  oxyCODONE-acetaminophen (ROXICET) 5-325 MG tablet Take 1-2 tablets by mouth every 4 (four) hours as needed for severe pain. 01/27/16   Geradine Girt, DO  polyethylene glycol (MIRALAX / GLYCOLAX) packet Take 17 g by mouth daily as needed for  mild constipation. 01/31/16   Albertine Patricia, MD    Physical Exam: Vitals:   08/05/16 1143  BP: (!) 144/80  Resp: 16  Temp: 97.9 F (36.6 C)  TempSrc: Oral  SpO2: 100%     General:  Appears calm and comfortable, no acute distress Eyes:  PERRL, EOMI, normal lids, iris ENT:  grossly normal hearing, lips & tongue, mucous membranes of her mouth are pink slightly dry Neck:  no LAD, masses or thyromegaly Cardiovascular:  RRR, no m/r/g. No LE edema.  Respiratory:  CTA bilaterally, no w/r/r. Normal respiratory effort. Abdomen:  soft, ntnd, positive bowel sounds throughout no guarding or rebounding Skin:  no rash or induration seen on limited exam Musculoskeletal:  Muscle tone BUE/BLE, left leg internally rotated and shorter than right. Decreased range of motion to that left hip due to pain tender to palpation of the joints without swelling/erythema Psychiatric:  grossly normal mood and affect, speech fluent and appropriate, AOx3 Neurologic:  CN 2-12 grossly intact, moves all extremities in coordinated fashion, sensation intact  Labs on Admission: I have personally reviewed following labs and imaging studies  CBC:  Recent Labs Lab 08/05/16 1142  WBC 10.1  NEUTROABS 6.1  HGB 10.9*  HCT 33.7*  MCV 101.8*  PLT [id number]*   Basic Metabolic Panel:  Recent Labs Lab 08/05/16 1142  NA 139  K 3.7  CL 107  CO2 26  GLUCOSE 78  BUN 16  CREATININE 0.52  CALCIUM 9.2   GFR: CrCl cannot be calculated (Unknown ideal weight.). Liver Function Tests:  Recent Labs Lab 08/05/16 1142  AST 17  ALT 10*  ALKPHOS 94  BILITOT 0.4  PROT 6.7  ALBUMIN 3.4*   No results for input(s): LIPASE, AMYLASE in the last 168 hours. No results for input(s): AMMONIA in the last 168 hours. Coagulation Profile:  Recent Labs Lab 08/05/16 1142  INR >10.00*   Cardiac Enzymes: No results for input(s): CKTOTAL, CKMB, CKMBINDEX, TROPONINI in the last 168 hours. BNP (last 3 results) No results for  input(s): PROBNP in the last 8760 hours. HbA1C: No results for input(s): HGBA1C in the last 72 hours. CBG: No results for input(s): GLUCAP in the last 168 hours. Lipid Profile: No results for input(s): CHOL, HDL, LDLCALC, TRIG, CHOLHDL, LDLDIRECT in the last 72 hours. Thyroid Function Tests: No results for input(s): TSH, T4TOTAL, FREET4, T3FREE, THYROIDAB in the last 72 hours. Anemia Panel: No results for input(s): VITAMINB12, FOLATE, FERRITIN, TIBC, IRON, RETICCTPCT in the last 72 hours. Urine analysis: No results found for: COLORURINE, APPEARANCEUR, LABSPEC, PHURINE, GLUCOSEU, HGBUR, BILIRUBINUR, KETONESUR, PROTEINUR, UROBILINOGEN, NITRITE, LEUKOCYTESUR  Creatinine Clearance: CrCl cannot be calculated (Unknown ideal weight.).  Sepsis Labs: @LABRCNTIP (procalcitonin:4,lacticidven:4) )No results found for this or any previous visit (from the past  240 hour(s)).   Radiological Exams on Admission: Chest Portable 1 View  Result Date: 08/05/2016 CLINICAL DATA:  Chest pain along the left side of the sternum status post fall EXAM: PORTABLE CHEST 1 VIEW COMPARISON:  None. FINDINGS: The heart size and mediastinal contours are within normal limits. Both lungs are clear. The visualized skeletal structures are unremarkable. IMPRESSION: No active disease. Electronically Signed   By: Nicole Devoid   On: 08/05/2016 12:09    EKG: Independently reviewed. Pending on admission  Assessment/Plan Principal Problem:   Hip fracture ([place]) Active Problems:   Essential hypertension   Generalized anxiety disorder   Malnutrition of moderate degree   Hypercoagulopathy ([place])   #1. Hip fracture left hip. Sent from [place] office. Imaging done there. Recurrent hip fracture after fall. -Admit -We'll obtain chest x-ray and EKG -We'll obtain CBC complete metabolic panel a PT PTT and INR -Urinalysis -Pain management -Heparin 2 doses given likely surgery tomorrow -Hold aspirin -Nothing by mouth after  midnight -Social work for possible placement  #2. Hypertension. Controlled. Home medications include Cipro, hydrochlorothiazide -Continue home meds for now  #3. Generalized anxiety disorder. Appears somewhat stable at baseline home medications include Xanax  4. Hypercoagulopathy. Etiology unclear. INR greater than 10. No blood thinners. Liver function tests within normal limit of normal. -Vitamin K by mouth -FOBT -Recheck in the morning    DVT prophylaxis: heparin x 2 doses in anticipation surgery tomorrow  Code Status: full  Family Communication: daughter at bedside  Disposition Plan: likely  Need snf  Consults called: came from [facility]  Admission status: inpatient    Radene Gunning MD [facility]  If 7PM-7AM, please contact night-coverage [email or web address] Password [email or web address]  08/05/2016, 2:03 PM

## 2016-08-06 ENCOUNTER — Inpatient Hospital Stay (HOSPITAL_COMMUNITY): Payer: Medicare HMO | Admitting: Anesthesiology

## 2016-08-06 ENCOUNTER — Inpatient Hospital Stay (HOSPITAL_COMMUNITY): Payer: Medicare HMO

## 2016-08-06 ENCOUNTER — Inpatient Hospital Stay (HOSPITAL_COMMUNITY): Admission: RE | Admit: 2016-08-06 | Payer: Medicare HMO | Source: Ambulatory Visit | Admitting: Orthopedic Surgery

## 2016-08-06 ENCOUNTER — Encounter (HOSPITAL_COMMUNITY)
Admission: AD | Disposition: A | Discharge status: Home Health | Payer: Self-pay | Source: Ambulatory Visit | Attending: Internal Medicine

## 2016-08-06 HISTORY — PX: HIP ARTHROPLASTY: SHX981

## 2016-08-06 HISTORY — PX: HARDWARE REMOVAL: SHX979

## 2016-08-06 LAB — CBC
HCT: 33.5 % — ABNORMAL LOW (ref 36.0–46.0)
HEMATOCRIT: 31.3 % — AB (ref 36.0–46.0)
HEMOGLOBIN: 11.2 g/dL — AB (ref 12.0–15.0)
Hemoglobin: 10 g/dL — ABNORMAL LOW (ref 12.0–15.0)
MCH: 32.5 pg (ref 26.0–34.0)
MCH: 32 pg (ref 26.0–34.0)
MCHC: 31.9 g/dL (ref 30.0–36.0)
MCHC: 33.4 g/dL (ref 30.0–36.0)
MCV: 101.6 fL — ABNORMAL HIGH (ref 78.0–100.0)
MCV: 95.7 fL (ref 78.0–100.0)
PLATELETS: 449 10*3/uL — AB (ref 150–400)
Platelets: 300 10*3/uL (ref 150–400)
RBC: 3.08 MIL/uL — AB (ref 3.87–5.11)
RBC: 3.5 MIL/uL — AB (ref 3.87–5.11)
RDW: 13.4 % (ref 11.5–15.5)
RDW: 15.5 % (ref 11.5–15.5)
WBC: 8.5 10*3/uL (ref 4.0–10.5)
WBC: 23.2 10*3/uL — ABNORMAL HIGH (ref 4.0–10.5)

## 2016-08-06 LAB — BASIC METABOLIC PANEL
ANION GAP: 7 (ref 5–15)
BUN: 21 mg/dL — AB (ref 6–20)
CO2: 28 mmol/L (ref 22–32)
Calcium: 8.7 mg/dL — ABNORMAL LOW (ref 8.9–10.3)
Chloride: 103 mmol/L (ref 101–111)
Creatinine, Ser: 0.6 mg/dL (ref 0.44–1.00)
GFR calc Af Amer: >60 mL/min (ref >60)
GFR calc non Af Amer: >60 mL/min (ref >60)
GLUCOSE: 77 mg/dL (ref 65–99)
POTASSIUM: 3.8 mmol/L (ref 3.5–5.1)
Sodium: 138 mmol/L (ref 135–145)

## 2016-08-06 LAB — PREPARE RBC (CROSSMATCH)

## 2016-08-06 LAB — PROTIME-INR
INR: 1.05
Prothrombin Time: 13.7 seconds (ref 11.4–15.2)

## 2016-08-06 SURGERY — HEMIARTHROPLASTY, HIP, DIRECT ANTERIOR APPROACH, FOR FRACTURE
Anesthesia: General | Site: Hip | Laterality: Left

## 2016-08-06 MED ORDER — FENTANYL CITRATE (PF) 100 MCG/2ML IJ SOLN
INTRAMUSCULAR | Status: AC
  Filled 2016-08-06: qty 2

## 2016-08-06 MED ORDER — ACETAMINOPHEN 325 MG PO TABS
650.0000 mg | ORAL_TABLET | Freq: Four times a day (QID) | ORAL | Status: DC | PRN
  Administered 2016-08-08: 650 mg via ORAL
  Filled 2016-08-06: qty 2

## 2016-08-06 MED ORDER — PROMETHAZINE HCL 25 MG/ML IJ SOLN: 6.2500 mg | INTRAMUSCULAR | Status: DC | PRN

## 2016-08-06 MED ORDER — SUGAMMADEX SODIUM 200 MG/2ML IV SOLN
INTRAVENOUS | Status: DC | PRN
  Administered 2016-08-06: 101.6 mg via INTRAVENOUS

## 2016-08-06 MED ORDER — FENTANYL CITRATE (PF) 100 MCG/2ML IJ SOLN
INTRAMUSCULAR | Status: DC | PRN
  Administered 2016-08-06 (×2): 50 ug via INTRAVENOUS
  Administered 2016-08-06: 100 ug via INTRAVENOUS
  Administered 2016-08-06 (×4): 50 ug via INTRAVENOUS

## 2016-08-06 MED ORDER — LACTATED RINGERS IV SOLN: INTRAVENOUS | Status: DC

## 2016-08-06 MED ORDER — TRANEXAMIC ACID 1000 MG/10ML IV SOLN
2000.0000 mg | Freq: Once | INTRAVENOUS | Status: DC
  Filled 2016-08-06: qty 20

## 2016-08-06 MED ORDER — PAROXETINE HCL 20 MG PO TABS
10.0000 mg | ORAL_TABLET | Freq: Every day | ORAL | Status: DC
  Administered 2016-08-06 – 2016-08-09 (×4): 10 mg via ORAL
  Filled 2016-08-06 (×4): qty 1

## 2016-08-06 MED ORDER — 0.9 % SODIUM CHLORIDE (POUR BTL) OPTIME
TOPICAL | Status: DC | PRN
  Administered 2016-08-06: 1000 mL

## 2016-08-06 MED ORDER — ARTIFICIAL TEARS OP OINT
TOPICAL_OINTMENT | OPHTHALMIC | Status: DC | PRN
  Administered 2016-08-06: 1 via OPHTHALMIC

## 2016-08-06 MED ORDER — EPHEDRINE SULFATE 50 MG/ML IJ SOLN
INTRAMUSCULAR | Status: DC | PRN
  Administered 2016-08-06: 10 mg via INTRAVENOUS

## 2016-08-06 MED ORDER — TRANEXAMIC ACID 1000 MG/10ML IV SOLN
1000.0000 mg | INTRAVENOUS | Status: AC
  Administered 2016-08-06: 1000 mg via INTRAVENOUS
  Filled 2016-08-06: qty 10

## 2016-08-06 MED ORDER — HYDROMORPHONE HCL 2 MG/ML IJ SOLN
INTRAMUSCULAR | Status: AC
  Filled 2016-08-06: qty 1

## 2016-08-06 MED ORDER — SENNA 8.6 MG PO TABS
1.0000 | ORAL_TABLET | Freq: Two times a day (BID) | ORAL | Status: DC
  Administered 2016-08-06 – 2016-08-09 (×6): 8.6 mg via ORAL
  Filled 2016-08-06 (×6): qty 1

## 2016-08-06 MED ORDER — ENOXAPARIN SODIUM 40 MG/0.4ML ~~LOC~~ SOLN
40.0000 mg | SUBCUTANEOUS | Status: DC
  Administered 2016-08-07 – 2016-08-09 (×3): 40 mg via SUBCUTANEOUS
  Filled 2016-08-06 (×3): qty 0.4

## 2016-08-06 MED ORDER — MIDAZOLAM HCL 2 MG/2ML IJ SOLN: 0.5000 mg | Freq: Once | INTRAMUSCULAR | Status: DC | PRN

## 2016-08-06 MED ORDER — ROCURONIUM BROMIDE 100 MG/10ML IV SOLN
INTRAVENOUS | Status: DC | PRN
  Administered 2016-08-06: 20 mg via INTRAVENOUS
  Administered 2016-08-06: 30 mg via INTRAVENOUS
  Administered 2016-08-06: 50 mg via INTRAVENOUS

## 2016-08-06 MED ORDER — SODIUM CHLORIDE 0.9 % IV SOLN: 10.0000 mL/h | Freq: Once | INTRAVENOUS | Status: DC

## 2016-08-06 MED ORDER — ALPRAZOLAM 0.5 MG PO TABS
0.5000 mg | ORAL_TABLET | Freq: Four times a day (QID) | ORAL | Status: DC | PRN
  Administered 2016-08-06 – 2016-08-09 (×10): 0.5 mg via ORAL
  Filled 2016-08-06 (×11): qty 1

## 2016-08-06 MED ORDER — CHLORHEXIDINE GLUCONATE 4 % EX LIQD
60.0000 mL | Freq: Once | CUTANEOUS | Status: AC
  Administered 2016-08-06: 4 via TOPICAL

## 2016-08-06 MED ORDER — LACTATED RINGERS IV SOLN
INTRAVENOUS | Status: DC
  Administered 2016-08-06 – 2016-08-07 (×2): via INTRAVENOUS

## 2016-08-06 MED ORDER — PHENYLEPHRINE 40 MCG/ML (10ML) SYRINGE FOR IV PUSH (FOR BLOOD PRESSURE SUPPORT)
PREFILLED_SYRINGE | INTRAVENOUS | Status: AC
  Filled 2016-08-06: qty 10

## 2016-08-06 MED ORDER — ALBUMIN HUMAN 5 % IV SOLN
INTRAVENOUS | Status: DC | PRN
  Administered 2016-08-06: 20:00:00 via INTRAVENOUS

## 2016-08-06 MED ORDER — MEPERIDINE HCL 25 MG/ML IJ SOLN: 6.2500 mg | INTRAMUSCULAR | Status: DC | PRN

## 2016-08-06 MED ORDER — ACETAMINOPHEN 650 MG RE SUPP: 650.0000 mg | Freq: Four times a day (QID) | RECTAL | Status: DC | PRN

## 2016-08-06 MED ORDER — ESMOLOL HCL 100 MG/10ML IV SOLN
INTRAVENOUS | Status: DC | PRN
  Administered 2016-08-06: 10 mg via INTRAVENOUS

## 2016-08-06 MED ORDER — ENOXAPARIN SODIUM 40 MG/0.4ML ~~LOC~~ SOLN: 40.0000 mg | SUBCUTANEOUS | 0 refills | Status: AC

## 2016-08-06 MED ORDER — PHENYLEPHRINE HCL 10 MG/ML IJ SOLN
INTRAMUSCULAR | Status: DC | PRN
  Administered 2016-08-06 (×4): 80 ug via INTRAVENOUS

## 2016-08-06 MED ORDER — MIDAZOLAM HCL 5 MG/5ML IJ SOLN
INTRAMUSCULAR | Status: DC | PRN
  Administered 2016-08-06: 2 mg via INTRAVENOUS

## 2016-08-06 MED ORDER — METHOCARBAMOL 500 MG PO TABS: 500.0000 mg | ORAL_TABLET | Freq: Four times a day (QID) | ORAL | 0 refills | Status: AC | PRN

## 2016-08-06 MED ORDER — LACTATED RINGERS IV SOLN
INTRAVENOUS | Status: DC | PRN
  Administered 2016-08-06 (×2): via INTRAVENOUS

## 2016-08-06 MED ORDER — HYDROMORPHONE HCL 1 MG/ML IJ SOLN
0.2500 mg | INTRAMUSCULAR | Status: DC | PRN
  Administered 2016-08-06 (×4): 0.5 mg via INTRAVENOUS

## 2016-08-06 MED ORDER — TRANEXAMIC ACID 1000 MG/10ML IV SOLN
INTRAVENOUS | Status: DC | PRN
  Administered 2016-08-06: 2000 mg via INTRAVENOUS

## 2016-08-06 MED ORDER — ONDANSETRON HCL 4 MG/2ML IJ SOLN
INTRAMUSCULAR | Status: DC | PRN
  Administered 2016-08-06: 4 mg via INTRAVENOUS

## 2016-08-06 MED ORDER — PROPOFOL 10 MG/ML IV BOLUS
INTRAVENOUS | Status: DC | PRN
  Administered 2016-08-06: 130 mg via INTRAVENOUS

## 2016-08-06 MED ORDER — SODIUM CHLORIDE 0.9 % IV SOLN
INTRAVENOUS | Status: DC | PRN
  Administered 2016-08-06: 1000 mg via INTRAVENOUS

## 2016-08-06 MED ORDER — POVIDONE-IODINE 10 % EX SWAB: 2.0000 "application " | Freq: Once | CUTANEOUS | Status: DC

## 2016-08-06 MED ORDER — PHENYLEPHRINE HCL 10 MG/ML IJ SOLN
INTRAMUSCULAR | Status: DC | PRN
  Administered 2016-08-06: 10 ug/min via INTRAVENOUS

## 2016-08-06 MED ORDER — LIDOCAINE HCL (CARDIAC) 20 MG/ML IV SOLN
INTRAVENOUS | Status: DC | PRN
  Administered 2016-08-06: 100 mg via INTRAVENOUS

## 2016-08-06 MED ORDER — HYDROCODONE-ACETAMINOPHEN 5-325 MG PO TABS: 1.0000 | ORAL_TABLET | Freq: Four times a day (QID) | ORAL | 0 refills | Status: AC | PRN

## 2016-08-06 SURGICAL SUPPLY — 64 items
BIT DRILL 7/64X5 DISP (BIT) ×3 IMPLANT
BLADE SAGITTAL 25.0X1.27X90 (BLADE) ×2 IMPLANT
BLADE SAGITTAL 25.0X1.27X90MM (BLADE) ×1
CLOSURE STERI-STRIP 1/2X4 (GAUZE/BANDAGES/DRESSINGS) ×1
CLSR STERI-STRIP ANTIMIC 1/2X4 (GAUZE/BANDAGES/DRESSINGS) ×2 IMPLANT
CONT SPEC 4OZ CLIKSEAL STRL BL (MISCELLANEOUS) ×6 IMPLANT
COVER SURGICAL LIGHT HANDLE (MISCELLANEOUS) ×3 IMPLANT
DRAPE C-ARM 42X72 X-RAY (DRAPES) ×3 IMPLANT
DRAPE INCISE IOBAN 66X45 STRL (DRAPES) ×6 IMPLANT
DRAPE ORTHO SPLIT 77X108 STRL (DRAPES) ×4
DRAPE SURG ORHT 6 SPLT 77X108 (DRAPES) ×2 IMPLANT
DRSG MEPILEX BORDER 4X12 (GAUZE/BANDAGES/DRESSINGS) ×3 IMPLANT
DRSG MEPILEX BORDER 4X4 (GAUZE/BANDAGES/DRESSINGS) ×3 IMPLANT
DRSG MEPILEX BORDER 4X8 (GAUZE/BANDAGES/DRESSINGS) ×3 IMPLANT
DURAPREP 26ML APPLICATOR (WOUND CARE) ×3 IMPLANT
ELECT BLADE 4.0 EZ CLEAN MEGAD (MISCELLANEOUS) ×3
ELECT CAUTERY BLADE 6.4 (BLADE) ×3 IMPLANT
ELECT REM PT RETURN 9FT ADLT (ELECTROSURGICAL) ×3
ELECTRODE BLDE 4.0 EZ CLN MEGD (MISCELLANEOUS) ×1 IMPLANT
ELECTRODE REM PT RTRN 9FT ADLT (ELECTROSURGICAL) ×1 IMPLANT
FACESHIELD WRAPAROUND (MASK) IMPLANT
GLOVE BIO SURGEON STRL SZ7.5 (GLOVE) ×12 IMPLANT
GLOVE BIOGEL PI IND STRL 7.5 (GLOVE) ×1 IMPLANT
GLOVE BIOGEL PI IND STRL 8 (GLOVE) ×2 IMPLANT
GLOVE BIOGEL PI INDICATOR 7.5 (GLOVE) ×2
GLOVE BIOGEL PI INDICATOR 8 (GLOVE) ×4
GLOVE SURG SS PI 7.5 STRL IVOR (GLOVE) ×3 IMPLANT
GOWN STRL REUS W/ TWL LRG LVL3 (GOWN DISPOSABLE) ×3 IMPLANT
GOWN STRL REUS W/ TWL XL LVL3 (GOWN DISPOSABLE) ×1 IMPLANT
GOWN STRL REUS W/TWL LRG LVL3 (GOWN DISPOSABLE) ×6
GOWN STRL REUS W/TWL XL LVL3 (GOWN DISPOSABLE) ×2
HEAD MODULAR ENDO (Orthopedic Implant) ×2 IMPLANT
HEAD UNPLR 45XMDLR STRL HIP (Orthopedic Implant) ×1 IMPLANT
HIP MODULAR SYS 155X16MM (Hips) ×3 IMPLANT
HIP MODULAR SYS 21MM P 0V40 (Hips) ×3 IMPLANT
K-WIRE  3.2X450M STR (WIRE)
K-WIRE 3.2X450M STR (WIRE)
KIT BASIN OR (CUSTOM PROCEDURE TRAY) ×3 IMPLANT
KIT ROOM TURNOVER OR (KITS) ×3 IMPLANT
KWIRE 3.2X450M STR (WIRE) IMPLANT
MANIFOLD NEPTUNE II (INSTRUMENTS) ×3 IMPLANT
NDL SUT 6 .5 CRC .975X.05 MAYO (NEEDLE) ×1 IMPLANT
NEEDLE MAYO TAPER (NEEDLE) ×2
NS IRRIG 1000ML POUR BTL (IV SOLUTION) ×3 IMPLANT
PACK TOTAL JOINT (CUSTOM PROCEDURE TRAY) ×3 IMPLANT
PAD ARMBOARD 7.5X6 YLW CONV (MISCELLANEOUS) ×6 IMPLANT
PILLOW ABDUCTION HIP (SOFTGOODS) ×3 IMPLANT
RETRIEVER SUT HEWSON (MISCELLANEOUS) ×3 IMPLANT
SLEEVE CABLE 2MM VT (Orthopedic Implant) ×3 IMPLANT
SLEEVE UNITRAX (Orthopedic Implant) ×3 IMPLANT
SUT ETHILON 3 0 PS 1 (SUTURE) ×6 IMPLANT
SUT FIBERWIRE #2 38 REV NDL BL (SUTURE) ×15
SUT MNCRL AB 4-0 PS2 18 (SUTURE) ×3 IMPLANT
SUT MON AB 2-0 CT1 36 (SUTURE) ×3 IMPLANT
SUT VIC AB 0 CT1 27 (SUTURE) ×2
SUT VIC AB 0 CT1 27XBRD ANBCTR (SUTURE) ×1 IMPLANT
SUT VIC AB 1 CT1 27 (SUTURE) ×6
SUT VIC AB 1 CT1 27XBRD ANBCTR (SUTURE) ×3 IMPLANT
SUTURE FIBERWR#2 38 REV NDL BL (SUTURE) ×5 IMPLANT
SYSTEM MODULAR HIP 155X16MM (Hips) ×1 IMPLANT
SYSTEM MODULAR HIP 21MM P 0V40 (Hips) ×1 IMPLANT
TOWEL OR 17X24 6PK STRL BLUE (TOWEL DISPOSABLE) ×3 IMPLANT
TOWEL OR 17X26 10 PK STRL BLUE (TOWEL DISPOSABLE) ×3 IMPLANT
YANKAUER SUCT BULB TIP NO VENT (SUCTIONS) ×3 IMPLANT

## 2016-08-06 NOTE — Anesthesia Procedure Notes (Addendum)
Procedure Name: Intubation Date/Time: 08/06/2016 4:29 PM Performed by: Jacquiline Doe A Pre-anesthesia Checklist: Patient identified, Emergency Drugs available, Suction available and Patient being monitored Patient Re-evaluated:Patient Re-evaluated prior to inductionOxygen Delivery Method: Circle System Utilized Preoxygenation: Pre-oxygenation with 100% oxygen Intubation Type: IV induction and Cricoid Pressure applied Ventilation: Mask ventilation without difficulty Laryngoscope Size: Mac and 4 Grade View: Grade I Tube type: Oral Tube size: 7.5 mm Number of attempts: 1 Airway Equipment and Method: Stylet Placement Confirmation: ETT inserted through vocal cords under direct vision,  positive ETCO2 and breath sounds checked- equal and bilateral Secured at: 22 cm Tube secured with: Tape Dental Injury: Teeth and Oropharynx as per pre-operative assessment

## 2016-08-06 NOTE — Progress Notes (Signed)
Initial Nutrition Assessment  DOCUMENTATION CODES:   Non-severe (moderate) malnutrition in context of chronic illness  INTERVENTION:  Once diet advances, continue Ensure Enlive po BID, each supplement provides 350 kcal and 20 grams of protein.  NUTRITION DIAGNOSIS:   Malnutrition related to chronic illness as evidenced by percent weight loss, moderate depletion of body fat, moderate depletions of muscle mass.  GOAL:   Patient will meet greater than or equal to 90% of their needs  MONITOR:   Supplement acceptance, Diet advancement, Labs, Weight trends, Skin, I & O's  REASON FOR ASSESSMENT:   Consult Assessment of nutrition requirement/status  ASSESSMENT:   65 y.o. female with medical history significant hypertension, anxiety, tobacco use who presented with failure of hardware from previous hip fracture.  Pt is currently NPO for surgery today. Pt reports eating well PTA with usual consumption of at least 2-3 meals a day with Ensure Shakes at least 2 times daily. Usual body weight ~125-130 lbs. Pt does reports weight of ~150 lbs 1 year ago. Pt with a 25% weight loss in 1 year. Pt currently has Ensure ordered. RD to continue with current orders. Nursing staff to provide once diet advances.   Nutrition-Focused physical exam completed. Findings are moderate fat depletion, moderate muscle depletion, and mild edema.   Labs and medications reviewed.   Diet Order:  Diet NPO time specified Diet NPO time specified Except for: Sips with Meds  Skin:  Reviewed, no issues  Last BM:  12/26  Height:   Ht Readings from Last 1 Encounters:  08/06/16 5\' 4"  (1.626 m)    Weight:   Wt Readings from Last 1 Encounters:  08/06/16 112 lb (50.8 kg)    Ideal Body Weight:  54.5 kg  BMI:  Body mass index is 19.22 kg/m.  Estimated Nutritional Needs:   Kcal:  1500-1700  Protein:  65-75 grams  Fluid:  >/= 1.5 L/day  EDUCATION NEEDS:   No education needs identified at this  time  Corrin Parker, MS, RD, LDN Pager # [phone] After hours/ weekend pager # [phone]

## 2016-08-06 NOTE — Progress Notes (Signed)
PROGRESS NOTE    Nicole Soto  [id number] DOB: Mar 12, 1951 DOA: 08/05/2016 PCP: Neale Burly, MD  Brief Narrative:Nicole Soto is a 65 y.o. female with medical history significant hypertension, anxiety, tobacco use who presented with failure of hardware from previous hip fracture.  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 -Ortho consulting, OR this afternoon likely    Essential hypertension -hold HCTZ, continue benazepril    Generalized anxiety disorder -resume xanax and paxil    Malnutrition of moderate degree -supplements as tolerated    Hypercoagulopathy ([place]) -likely lab error, repeat normal  DVT prophylaxis: SCDs, start lovenox tomorrow Code Status:Full Code Family Communication:None at bedside Disposition Plan:Will likely need SNF   Consultants:   Ortho   Subjective: Upset anxious abt this whole situation  Objective: Vitals:   08/05/16 1143 08/05/16 2052 08/06/16 0615  BP: (!) 144/80 (!) 110/55 108/60  Pulse:  78 82  Resp: 16 16 16   Temp: 97.9 F (36.6 C) 98 F (36.7 C) 98 F (36.7 C)  TempSrc: Oral Oral Oral  SpO2: 100% 97% 97%    Intake/Output Summary (Last 24 hours) at 08/06/16 1332 Last data filed at 08/06/16 1300  Gross per 24 hour  Intake                0 ml  Output              100 ml  Net             -100 ml   There were no vitals filed for this visit.  Examination:  General exam: Appears calm and comfortable  Respiratory system: Clear to auscultation. Respiratory effort normal. Cardiovascular system: S1 & S2 heard, RRR. No JVD, murmurs, rubs, gallops or clicks. No pedal edema. Gastrointestinal system: Abdomen is nondistended, soft and nontender. Normal bowel sounds heard. Central nervous system: Alert and oriented. No focal neurological deficits. Extremities: L hip ext rotated Skin: No rashes, lesions or ulcers Psychiatry: Judgement and insight appear normal. Mood & affect appropriate.       Data Reviewed: I have personally reviewed following labs and imaging studies  CBC:  Recent Labs Lab 08/05/16 1142 08/06/16 0427  WBC 10.1 8.5  NEUTROABS 6.1  --   HGB 10.9* 10.0*  HCT 33.7* 31.3*  MCV 101.8* 101.6*  PLT 495* 123XX123*   Basic Metabolic Panel:  Recent Labs Lab 08/05/16 1142 08/06/16 0427  NA 139 138  K 3.7 3.8  CL 107 103  CO2 26 28  GLUCOSE 78 77  BUN 16 21*  CREATININE 0.52 0.60  CALCIUM 9.2 8.7*   GFR: CrCl cannot be calculated (Unknown ideal weight.). Liver Function Tests:  Recent Labs Lab 08/05/16 1142  AST 17  ALT 10*  ALKPHOS 94  BILITOT 0.4  PROT 6.7  ALBUMIN 3.4*   No results for input(s): LIPASE, AMYLASE in the last 168 hours. No results for input(s): AMMONIA in the last 168 hours. Coagulation Profile:  Recent Labs Lab 08/05/16 1142 08/06/16 0427  INR >10.00* 1.05   Cardiac Enzymes: No results for input(s): CKTOTAL, CKMB, CKMBINDEX, TROPONINI in the last 168 hours. BNP (last 3 results) No results for input(s): PROBNP in the last 8760 hours. HbA1C: No results for input(s): HGBA1C in the last 72 hours. CBG: No results for input(s): GLUCAP in the last 168 hours. Lipid Profile: No results for input(s): CHOL, HDL, LDLCALC, TRIG, CHOLHDL, LDLDIRECT in the last 72 hours. Thyroid Function  Tests: No results for input(s): TSH, T4TOTAL, FREET4, T3FREE, THYROIDAB in the last 72 hours. Anemia Panel: No results for input(s): VITAMINB12, FOLATE, FERRITIN, TIBC, IRON, RETICCTPCT in the last 72 hours. Urine analysis: No results found for: COLORURINE, APPEARANCEUR, LABSPEC, PHURINE, GLUCOSEU, HGBUR, BILIRUBINUR, KETONESUR, PROTEINUR, UROBILINOGEN, NITRITE, LEUKOCYTESUR Sepsis Labs: @LABRCNTIP (procalcitonin:4,lacticidven:4)  ) Recent Results (from the past 240 hour(s))  Surgical PCR screen     Status: None   Collection Time: 08/05/16  2:03 PM  Result Value Ref Range Status   MRSA, PCR NEGATIVE NEGATIVE Final   Staphylococcus  aureus NEGATIVE NEGATIVE Final    Comment:        The Xpert SA Assay (FDA approved for NASAL specimens in patients over 24 years of age), is one component of a comprehensive surveillance program.  Test performance has been validated by [facility] for patients greater than or equal to 48 year old. It is not intended to diagnose infection nor to guide or monitor treatment.          Radiology Studies: Chest Portable 1 View  Result Date: 08/05/2016 CLINICAL DATA:  Chest pain along the left side of the sternum status post fall EXAM: PORTABLE CHEST 1 VIEW COMPARISON:  None. FINDINGS: The heart size and mediastinal contours are within normal limits. Both lungs are clear. The visualized skeletal structures are unremarkable. IMPRESSION: No active disease. Electronically Signed   By: Kathreen Devoid   On: 08/05/2016 12:09        Scheduled Meds: . benazepril  20 mg Oral Daily  . docusate sodium  100 mg Oral BID  . feeding supplement (ENSURE ENLIVE)  237 mL Oral BID BM  . hydrochlorothiazide  12.5 mg Oral Daily  . PARoxetine  10 mg Oral Daily  . povidone-iodine  2 application Topical Once   Continuous Infusions: . lactated ringers       LOS: 1 day    Time spent: 73min    Domenic Polite, MD [facility] Pager [phone]  If 7PM-7AM, please contact night-coverage [email or web address] Password [email or web address] 08/06/2016, 1:32 PM

## 2016-08-06 NOTE — Anesthesia Preprocedure Evaluation (Addendum)
Anesthesia Evaluation  Patient identified by MRN, date of birth, ID band Patient awake    Reviewed: Allergy & Precautions, NPO status , Patient's Chart, lab work & pertinent test results  History of Anesthesia Complications Negative for: history of anesthetic complications  Airway Mallampati: II  TM Distance: >3 FB Neck ROM: Full    Dental  (+) Missing, Chipped, Dental Advisory Given   Pulmonary COPD, Current Smoker,    breath sounds clear to auscultation       Cardiovascular hypertension, Pt. on medications (-) angina Rhythm:Regular Rate:Normal     Neuro/Psych Anxiety Chronic back pain: narcotics    GI/Hepatic negative GI ROS, GERD  Medicated and Controlled,(+) Hepatitis -  Endo/Other  negative endocrine ROS  Renal/GU negative Renal ROS     Musculoskeletal  (+) Arthritis , Osteoarthritis,    Abdominal   Peds  Hematology  (+) Blood dyscrasia (Hb 10.0, INR 1.05), anemia ,   Anesthesia Other Findings   Reproductive/Obstetrics                            Anesthesia Physical Anesthesia Plan  ASA: III  Anesthesia Plan: General   Post-op Pain Management:    Induction: Intravenous  Airway Management Planned: Oral ETT  Additional Equipment:   Intra-op Plan:   Post-operative Plan: Extubation in OR  Informed Consent: I have reviewed the patients History and Physical, chart, labs and discussed the procedure including the risks, benefits and alternatives for the proposed anesthesia with the patient or authorized representative who has indicated his/her understanding and acceptance.   Dental advisory given  Plan Discussed with: CRNA and Surgeon  Anesthesia Plan Comments: (Plan routine monitors, GETA Transfusion likely)        Anesthesia Quick Evaluation

## 2016-08-06 NOTE — Anesthesia Postprocedure Evaluation (Signed)
Anesthesia Post Note  Patient: Rebcca Knipfer  Procedure(s) Performed: Procedure(s) (LRB): HARDWARE REMOVAL LEFT HIP ARTHROPLASTY BIPOLAR HIP (HEMIARTHROPLASTY) (Left) HARDWARE REMOVAL (Left)  Patient location during evaluation: PACU Anesthesia Type: General Level of consciousness: awake, sedated and oriented Pain management: pain level controlled Vital Signs Assessment: post-procedure vital signs reviewed and stable Respiratory status: spontaneous breathing, nonlabored ventilation, respiratory function stable and patient connected to nasal cannula oxygen Cardiovascular status: blood pressure returned to baseline and stable Postop Assessment: no signs of nausea or vomiting Anesthetic complications: no       Last Vitals:  Vitals:   08/06/16 2200 08/06/16 2220  BP:  127/62  Pulse:  96  Resp:  20  Temp: 36.4 C 36.4 C    Last Pain:  Vitals:   08/06/16 2246  TempSrc:   PainSc: 10-Worst pain ever                 Vermell Madrid,JAMES TERRILL

## 2016-08-06 NOTE — Op Note (Signed)
08/05/2016 - 08/06/2016  8:06 PM  PATIENT:  Nicole Soto    PRE-OPERATIVE DIAGNOSIS:  Failed Hardware Left   POST-OPERATIVE DIAGNOSIS:  Same  PROCEDURE:  HARDWARE REMOVAL LEFT HIP ARTHROPLASTY BIPOLAR HIP (HEMIARTHROPLASTY), HARDWARE REMOVAL  SURGEON:  Miranda Garber, Ernesta Amble, MD  ASSISTANT: Roxan Hockey, PA-C, he was present and scrubbed throughout the case, critical for completion in a timely fashion, and for retraction, instrumentation, and closure.   ANESTHESIA:   gen  PREOPERATIVE INDICATIONS:  Nicole Soto is a  65 y.o. female with a diagnosis of Failed Hardware Left  who failed conservative measures and elected for surgical management.    The risks benefits and alternatives were discussed with the patient preoperatively including but not limited to the risks of infection, bleeding, nerve injury, cardiopulmonary complications, the need for revision surgery, among others, and the patient was willing to proceed.  OPERATIVE IMPLANTS: stryker restoration modular. 155x16 stem, 45 head, -4 neck, 21 + 0 body  OPERATIVE FINDINGS: unstable fracture  BLOOD LOSS: [id number]  COMPLICATIONS: none  TOURNIQUET TIME: none  OPERATIVE PROCEDURE:  Patient was identified in the preoperative holding area and site was marked by me She was transported to the operating theater and placed on the table in supine position taking care to pad all bony prominences. After a preincinduction time out anesthesia was induced. The left lower extremity was prepped and draped in normal sterile fashion and a pre-incision timeout was performed. She received vanc for preoperative antibiotics.   She is placed in lateral position again padding all bony prominences the left lower extremity was prepped and draped in normal sterile fashion.  I made a small incision under distal interlock and was able to remove her distal interlock without complication.  I then turned my attention proximally I made a posterior lateral  incision and a posterior approach to the hip. I identified her IT band and incise this in line with the incision.  Her greater trochanter been fragmented and had a fibrous partial union. I was able to keep this as 1 piece. Identified the proximal portion of the nail and removed it.  Next I identified the head and lag screw was able to get driver on this and remove it intact. I then had to fragment the head to remove it completely.  I sized the acetabulum to a 45.  Next I sequentially reamed the canal for the 155 stem up to a 16 mm I was able to implant that had an excellent purchase with it. I selected the standard body size I did split the capsule in place retention stitches in this.  I then trialed a standard -4 head it had an excellent reduction was stable leg lengths felt appropriate she was significantly tight due to her shortening for an extended period of time. I elected not to try and lengthen her any additional amount to protect her neurovascular structures and to make reduction possible.  I did then not open the above-mentioned implants and inserted those I passed a cable around her greater trochanteric piece I was not able to use the calcar piece with holes for the cable due to her soft tissue tension as it is a +10. I elected to place a cable around the stem of the above-mentioned implant. I then reduced the hip it was stable as happy with the reduction and leg lengths I repaired the capsule I tensioned the cable was happy with the repeat production of the the reduction of the greater trochanter.  I then  thoroughly irrigated I placed a exam the wound I closed the IT band followed by skin in layers.  Sterile dressing was applied she was awoken and taken to [facility] to the PACU in stable condition  POST OPERATIVE PLAN: Posterior hip precautions weightbearing as tolerated mobilize and chemical DVT prophylaxis

## 2016-08-06 NOTE — Interval H&P Note (Signed)
History and Physical Interval Note:  08/06/2016 8:02 AM  Nicole Soto  has presented today for surgery, with the diagnosis of Failed Hardware Left   The various methods of treatment have been discussed with the patient and family. After consideration of risks, benefits and other options for treatment, the patient has consented to  Procedure(s): HARDWARE REMOVAL LEFT HIP ARTHROPLASTY BIPOLAR HIP (HEMIARTHROPLASTY) (Left) HARDWARE REMOVAL (Left) as a surgical intervention .  The patient's history has been reviewed, patient examined, no change in status, stable for surgery.  I have reviewed the patient's chart and labs.  Questions were answered to the patient's satisfaction.     MURPHY, TIMOTHY D

## 2016-08-06 NOTE — H&P (View-Only) (Signed)
   ORTHOPAEDIC CONSULTATION  REQUESTING PHYSICIAN: Mashell Sieben Soto [name], MD  Chief Complaint: Left hip nonunion and hardware fracture  HPI: Nicole Soto is a 65 y.o. female who complains of  Severe increase in pain in her L hip over the last week. X-ray done at outside hospital shows failure of her IM nail with loss of reduction  Past Medical History:  Diagnosis Date  . Anxiety   . DDD (degenerative disc disease), cervical   . Hepatitis A   . Hypertension    Past Surgical History:  Procedure Laterality Date  . BLADDER SURGERY    . FEMUR IM NAIL Left 01/27/2016   Procedure: INTRAMEDULLARY (IM) NAIL FEMORAL ;  Surgeon: Nicole Soto Nicole Kreiter, MD;  Location: [facility] OR;  Service: Orthopedics;  Laterality: Left;   Social History   Social History  . Marital status: Single    Spouse name: N/A  . Number of children: 1  . Years of education: N/A   Occupational History  . Retired    Social History Main Topics  . Smoking status: Current Every Day Smoker  . Smokeless tobacco: Not on file  . Alcohol use 0.0 oz/week     Comment: Very little  . Drug use: No  . Sexual activity: Not on file   Other Topics Concern  . Not on file   Social History Narrative   he patient smokes 3 1/2 to 5 cigarettes a day.  She lives alone with 2 dogs. The patient has never been married, and is estranged from her daughter.     Family History  Problem Relation Age of Onset  . Heart attack Mother   . Heart attack Brother    Allergies  Allergen Reactions  . Penicillins     Syncope  . Tape Rash    M3 Tape   Prior to Admission medications   Medication Sig Start Date End Date Taking? Authorizing Provider  ALPRAZolam (XANAX) 0.5 MG tablet Take 1 tablet (0.5 mg total) by mouth 3 (three) times daily. 01/31/16   Dawood S Elgergawy, MD  aspirin EC 325 MG tablet Take 1 tablet (325 mg total) by mouth daily. 01/27/16   Jessica U Vann, DO  b complex vitamins tablet Take 1 tablet by mouth daily.    Historical Provider,  MD  benazepril (LOTENSIN) 20 MG tablet Take 20 mg by mouth daily. 01/20/16   Historical Provider, MD  feeding supplement, ENSURE ENLIVE, (ENSURE ENLIVE) LIQD Take 237 mLs by mouth 2 (two) times daily between meals. 01/31/16   Dawood S Elgergawy, MD  hydrochlorothiazide (MICROZIDE) 12.5 MG capsule Take 12.5 mg by mouth daily. 01/20/16   Historical Provider, MD  Multiple Vitamin (MULTIVITAMIN) tablet Take 1 tablet by mouth daily.    Historical Provider, MD  ondansetron (ZOFRAN) 4 MG tablet Take 1 tablet (4 mg total) by mouth every 8 (eight) hours as needed for nausea or vomiting. 01/27/16   Jessica U Vann, DO  oxyCODONE-acetaminophen (ROXICET) 5-325 MG tablet Take 1-2 tablets by mouth every 4 (four) hours as needed for severe pain. 01/27/16   Jessica U Vann, DO  polyethylene glycol (MIRALAX / GLYCOLAX) packet Take 17 g by mouth daily as needed for mild constipation. 01/31/16   Dawood S Elgergawy, MD   Chest Portable 1 View  Result Date: 08/05/2016 CLINICAL DATA:  Chest pain along the left side of the sternum status post fall EXAM: PORTABLE CHEST 1 VIEW COMPARISON:  None. FINDINGS: The heart size and mediastinal contours are within normal   limits. Both lungs are clear. The visualized skeletal structures are unremarkable. IMPRESSION: No active disease. Electronically Signed   By: Hetal  Patel   On: 08/05/2016 12:09    Positive ROS: All other systems have been reviewed and were otherwise negative with the exception of those mentioned in the HPI and as above.  Labs cbc  Recent Labs  08/05/16 1142  WBC 10.1  HGB 10.9*  HCT 33.7*  PLT 495*    Labs inflam No results for input(s): CRP in the last 72 hours.  Invalid input(s): ESR  Labs coag No results for input(s): INR, PTT in the last 72 hours.  Invalid input(s): PT  No results for input(s): NA, K, CL, CO2, GLUCOSE, BUN, CREATININE, CALCIUM in the last 72 hours.  Physical Exam: There were no vitals filed for this visit. General: Alert, no  acute distress Cardiovascular: No pedal edema Respiratory: No cyanosis, no use of accessory musculature GI: No organomegaly, abdomen is soft and non-tender Skin: No lesions in the area of chief complaint other than those listed below in MSK exam.  Neurologic: Sensation intact distally save for the below mentioned MSK exam Psychiatric: Patient is competent for consent with normal mood and affect Lymphatic: No axillary or cervical lymphadenopathy  MUSCULOSKELETAL:  LLE: pain with ROM, NVI Other extremities are atraumatic with painless ROM and NVI.  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  Bedrest for now   Nicole Westrich D, MD Cell [phone]   08/05/2016 12:15 PM    

## 2016-08-06 NOTE — Transfer of Care (Signed)
Immediate Anesthesia Transfer of Care Note  Patient: Nicole Soto  Procedure(s) Performed: Procedure(s): HARDWARE REMOVAL LEFT HIP ARTHROPLASTY BIPOLAR HIP (HEMIARTHROPLASTY) (Left) HARDWARE REMOVAL (Left)  Patient Location: PACU  Anesthesia Type:General  Level of Consciousness: awake  Airway & Oxygen Therapy: Patient Spontanous Breathing and Patient connected to nasal cannula oxygen  Post-op Assessment: Report given to RN and Post -op Vital signs reviewed and stable  Post vital signs: Reviewed and stable  Last Vitals:  Vitals:   08/06/16 0615 08/06/16 1300  BP: 108/60 106/64  Pulse: 82 78  Resp: 16 16  Temp: 36.7 C 36.6 C    Last Pain:  Vitals:   08/06/16 1300  TempSrc: Oral  PainSc:       Patients Stated Pain Goal: 0 ([id number])  Complications: No apparent anesthesia complications

## 2016-08-07 DIAGNOSIS — S72001D Fracture of unspecified part of neck of right femur, subsequent encounter for closed fracture with routine healing: Secondary | ICD-10-CM

## 2016-08-07 LAB — CBC
HCT: 28.2 % — ABNORMAL LOW (ref 36.0–46.0)
Hemoglobin: 9.4 g/dL — ABNORMAL LOW (ref 12.0–15.0)
MCH: 31.5 pg (ref 26.0–34.0)
MCHC: 33.3 g/dL (ref 30.0–36.0)
MCV: 94.6 fL (ref 78.0–100.0)
Platelets: 276 10*3/uL (ref 150–400)
RBC: 2.98 MIL/uL — ABNORMAL LOW (ref 3.87–5.11)
RDW: 16.8 % — ABNORMAL HIGH (ref 11.5–15.5)
WBC: 12.2 10*3/uL — ABNORMAL HIGH (ref 4.0–10.5)

## 2016-08-07 MED ORDER — DOXYCYCLINE HYCLATE 100 MG PO TABS
100.0000 mg | ORAL_TABLET | Freq: Two times a day (BID) | ORAL | Status: DC
  Administered 2016-08-07 – 2016-08-08 (×3): 100 mg via ORAL
  Filled 2016-08-07 (×3): qty 1

## 2016-08-07 NOTE — Progress Notes (Signed)
Physical Therapy Treatment Patient Details Name: Nicole Soto MRN: [id number] DOB: [birth year] Today's Date: 08/07/2016    History of Present Illness Pt is a 65 yo female admitted on 08/06/16 for L THA following fall and failed L hemiarthroplasty s/p 6 months. PMH significant for Anxiety, DDD, Hep A, HTN.     PT Comments    Pt seen for second visit as daughter is present and pt wants to return home. Pt is able to perform transfers and gait with Min A this session from recliner and raised commode requiring increased time to perform mobility. Pt is fixated on returning home to her pets. Daughter is a recovering addict and is concerned about her ability to manage this pt's medications when returning home. Pt's pain is still not controlled with her constantly reporting 8-9/10 throughout session. Pt is still a good candidate for SNF placement, but if pain is more controlled, mobility improves, and daughter and grand daughter are able, pt may be able to return home with HHPT.    Follow Up Recommendations  Home health PT;SNF     Equipment Recommendations  Rolling walker with 5" wheels    Recommendations for Other Services       Precautions / Restrictions Precautions Precautions: Posterior Hip;Fall Precaution Booklet Issued: Yes (comment) Precaution Comments: Verbally reviewed precautions and pt recalled 2/3 Restrictions Weight Bearing Restrictions: Yes LLE Weight Bearing: Touchdown weight bearing    Mobility  Bed Mobility Overal bed mobility: Needs Assistance Bed Mobility: Sit to Supine     Supine to sit: Mod assist;HOB elevated Sit to supine: Mod assist   General bed mobility comments: Mod A to assist LLE into bed. Pt is able to maneuver self in bed  without assistance.   Transfers Overall transfer level: Needs assistance Equipment used: Rolling walker (2 wheeled) Transfers: Sit to/from Stand Sit to Stand: Min assist         General transfer comment: Min A for safety  from recliner and raised commode  Ambulation/Gait Ambulation/Gait assistance: Min assist Ambulation Distance (Feet): 30 Feet Assistive device: Rolling walker (2 wheeled) Gait Pattern/deviations: Step-to pattern;Decreased weight shift to left;Antalgic Gait velocity: decreased Gait velocity interpretation: Below normal speed for age/gender General Gait Details: Moderate antalgic gait, good adherence to weight bearing restrictions.    Stairs            Wheelchair Mobility    Modified Rankin (Stroke Patients Only)       Balance Overall balance assessment: Needs assistance Sitting-balance support: No upper extremity supported;Feet supported Sitting balance-Leahy Scale: Fair Sitting balance - Comments: sitting EOB with no back support   Standing balance support: Bilateral upper extremity supported Standing balance-Leahy Scale: Poor Standing balance comment: relies on RW for stability                    Cognition Arousal/Alertness: Awake/alert Behavior During Therapy: WFL for tasks assessed/performed Overall Cognitive Status: Within Functional Limits for tasks assessed                 General Comments: Tangential at times    Exercises Total Joint Exercises Ankle Circles/Pumps: AROM;Both;20 reps;Supine Quad Sets: AROM;Left;10 reps;Supine    General Comments General comments (skin integrity, edema, etc.): Pts daughter is present throughout this visit. Daughter is a recovering addict and is unsure if she will be able to manage her mothers medications given her situation. pt really wants to return home and get back to her pets.       Pertinent Vitals/Pain  Pain Assessment: 0-10 Pain Score: 8  Pain Location: left hip Pain Descriptors / Indicators: Burning;Stabbing Pain Intervention(s): Monitored during session;Limited activity within patient's tolerance;Premedicated before session;Patient requesting pain meds-RN notified;Ice applied    Home Living  Family/patient expects to be discharged to:: Private residence Living Arrangements: Alone;Other (Comment) (has two dogs that stay outside, 1 cat in the house) Available Help at Discharge: Family;Neighbor;Available PRN/intermittently (daughter and granddaughter and neighbors) Type of Home: House Home Access: Stairs to enter Entrance Stairs-Rails: Right Home Layout: One level Home Equipment: Environmental consultant - 2 wheels;Bedside commode;Shower seat;Grab bars - tub/shower;Other (comment);Walker - 4 wheels (suction cup grab bars) Additional Comments: Reports having lots of rugs but these are "taped" down.    Prior Function Level of Independence: Independent      Comments: was completely independent, fed and gave water to her dogs, was driving prior to surgery, drives SUV   PT Goals (current goals can now be found in the care plan section) Acute Rehab PT Goals Patient Stated Goal: to go home PT Goal Formulation: With patient Time For Goal Achievement: 08/14/16 Potential to Achieve Goals: Good Progress towards PT goals: Progressing toward goals    Frequency    Min 5X/week      PT Plan Current plan remains appropriate    Co-evaluation             End of Session Equipment Utilized During Treatment: Gait belt Activity Tolerance: Patient limited by pain Patient left: in bed;with call bell/phone within reach;with family/visitor present;with SCD's reapplied     Time: [id number] PT Time Calculation (min) (ACUTE ONLY): 48 min  Charges:  $Gait Training: 8-22 mins $Therapeutic Exercise: 8-22 mins $Therapeutic Activity: 8-22 mins                    G Codes:      Scheryl Marten PT, DPT  [phone]  08/07/2016, 3:13 PM

## 2016-08-07 NOTE — Progress Notes (Addendum)
PROGRESS NOTE    Nicole Soto  [id number] DOB: [birth year] DOA: 08/05/2016 PCP: Neale Burly, MD  Brief Narrative:Nicole Soto is a 65 y.o. female with medical history significant hypertension, anxiety, tobacco use who presented with failure of hardware from previous hip fracture.  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 -Ortho consulting, s/p HARDWARE REMOVAL LEFT HIP ARTHROPLASTY BIPOLAR HIP (HEMIARTHROPLASTY) 12/28: required 3units PRBC intraop -Pt consult, may need SNF    Essential hypertension -hold HCTZ, continue benazepril    Generalized anxiety disorder -resumed xanax and paxil    Malnutrition of moderate degree -supplements as tolerated    Hypercoagulopathy ([place]) -likely lab error, repeat normal  DVT prophylaxis: SCDs, start lovenox today Code Status:Full Code Family Communication:None at bedside Disposition Plan:Will likely need SNF   Consultants:   Ortho   Subjective: Feels ok, in better spirits  Objective: Vitals:   08/07/16 0012 08/07/16 0428 08/07/16 0800 08/07/16 1351  BP: 131/61 127/76 116/61 (!) 108/53  Pulse: 98 95 100 97  Resp: 18 18  18   Temp: 97.7 F (36.5 C) 97.7 F (36.5 C)  98.4 F (36.9 C)  TempSrc: Oral Oral  Oral  SpO2: 99% 98% 97% 97%  Weight:      Height:        Intake/Output Summary (Last 24 hours) at 08/07/16 1448 Last data filed at 08/07/16 1100  Gross per 24 hour  Intake           4974.5 ml  Output             3200 ml  Net           1774.5 ml   Filed Weights   08/06/16 1456  Weight: 50.8 kg (112 lb)    Examination:  General exam: Appears calm and comfortable  Respiratory system: Clear to auscultation. Respiratory effort normal. Cardiovascular system: S1 & S2 heard, RRR. No JVD, murmurs, rubs, gallops or clicks. No pedal edema. Gastrointestinal system: Abdomen is nondistended, soft and nontender. Normal bowel sounds heard. Central nervous system: Alert and  oriented. No focal neurological deficits. Extremities: L hip ext rotated Skin: No rashes, lesions or ulcers Psychiatry: Judgement and insight appear normal. Mood & affect appropriate.     Data Reviewed: I have personally reviewed following labs and imaging studies  CBC:  Recent Labs Lab 08/05/16 1142 08/06/16 0427 08/06/16 2112 08/07/16 0821  WBC 10.1 8.5 23.2* 12.2*  NEUTROABS 6.1  --   --   --   HGB 10.9* 10.0* 11.2* 9.4*  HCT 33.7* 31.3* 33.5* 28.2*  MCV 101.8* 101.6* 95.7 94.6  PLT 495* 449* 300 [id number]   Basic Metabolic Panel:  Recent Labs Lab 08/05/16 1142 08/06/16 0427  NA 139 138  K 3.7 3.8  CL 107 103  CO2 26 28  GLUCOSE 78 77  BUN 16 21*  CREATININE 0.52 0.60  CALCIUM 9.2 8.7*   GFR: Estimated Creatinine Clearance: 54.7 mL/min (by C-G formula based on SCr of 0.6 mg/dL). Liver Function Tests:  Recent Labs Lab 08/05/16 1142  AST 17  ALT 10*  ALKPHOS 94  BILITOT 0.4  PROT 6.7  ALBUMIN 3.4*   No results for input(s): LIPASE, AMYLASE in the last 168 hours. No results for input(s): AMMONIA in the last 168 hours. Coagulation Profile:  Recent Labs Lab 08/05/16 1142 08/06/16 0427  INR >10.00* 1.05   Cardiac Enzymes: No results for input(s): CKTOTAL, CKMB, CKMBINDEX, TROPONINI in the last  168 hours. BNP (last 3 results) No results for input(s): PROBNP in the last 8760 hours. HbA1C: No results for input(s): HGBA1C in the last 72 hours. CBG: No results for input(s): GLUCAP in the last 168 hours. Lipid Profile: No results for input(s): CHOL, HDL, LDLCALC, TRIG, CHOLHDL, LDLDIRECT in the last 72 hours. Thyroid Function Tests: No results for input(s): TSH, T4TOTAL, FREET4, T3FREE, THYROIDAB in the last 72 hours. Anemia Panel: No results for input(s): VITAMINB12, FOLATE, FERRITIN, TIBC, IRON, RETICCTPCT in the last 72 hours. Urine analysis: No results found for: COLORURINE, APPEARANCEUR, LABSPEC, PHURINE, GLUCOSEU, HGBUR, BILIRUBINUR, KETONESUR,  PROTEINUR, UROBILINOGEN, NITRITE, LEUKOCYTESUR Sepsis Labs: @LABRCNTIP (procalcitonin:4,lacticidven:4)  ) Recent Results (from the past 240 hour(s))  Surgical PCR screen     Status: None   Collection Time: 08/05/16  2:03 PM  Result Value Ref Range Status   MRSA, PCR NEGATIVE NEGATIVE Final   Staphylococcus aureus NEGATIVE NEGATIVE Final    Comment:        The Xpert SA Assay (FDA approved for NASAL specimens in patients over 64 years of age), is one component of a comprehensive surveillance program.  Test performance has been validated by [facility] for patients greater than or equal to 78 year old. It is not intended to diagnose infection nor to guide or monitor treatment.   Aerobic/Anaerobic Culture (surgical/deep wound)     Status: None (Preliminary result)   Collection Time: 08/06/16  5:29 PM  Result Value Ref Range Status   Specimen Description TISSUE LEFT HIP  Final   Special Requests PT ON VANC  Final   Gram Stain   Final    RARE WBC PRESENT, PREDOMINANTLY PMN NO ORGANISMS SEEN    Culture NO GROWTH 1 DAY  Final   Report Status PENDING  Incomplete         Radiology Studies: Dg Hip Port Unilat With Pelvis 1v Left  Result Date: 08/06/2016 CLINICAL DATA:  Left hip fracture. Status post left hip replacement. EXAM: DG HIP (WITH OR WITHOUT PELVIS) 1V PORT LEFT COMPARISON:  01/27/2016 FINDINGS: A left hip hemiarthroplasty with a long intramedullary stem, appears well-seated and well-aligned. There is no acute fracture or evidence of an operative complication. IMPRESSION: Well-positioned left hip hemiarthroplasty. Electronically Signed   By: Lajean Manes M.D.   On: 08/06/2016 21:31        Scheduled Meds: . benazepril  20 mg Oral Daily  . docusate sodium  100 mg Oral BID  . doxycycline  100 mg Oral Q12H  . enoxaparin (LOVENOX) injection  40 mg Subcutaneous Q24H  . feeding supplement (ENSURE ENLIVE)  237 mL Oral BID BM  . PARoxetine  10 mg Oral Daily  . senna  1  tablet Oral BID  . tranexamic acid (CYKLOKAPRON) topical -INTRAOP  2,000 mg Topical Once   Continuous Infusions: . lactated ringers 75 mL/hr at 08/06/16 2302     LOS: 2 days    Time spent: 68min    Domenic Polite, MD [facility] Pager [phone]  If 7PM-7AM, please contact night-coverage [email or web address] Password [facility] 08/07/2016, 2:48 PM

## 2016-08-07 NOTE — Clinical Social Work Note (Signed)
Per MD in progression pt will probably need SNF at d/c. PT eval pending. Pt previously sent to [facility] CSW will continue to follow.  [address] [place]

## 2016-08-07 NOTE — Evaluation (Signed)
Physical Therapy Evaluation Patient Details Name: Nicole Soto MRN: [id number] DOB: 04/04/1951 Today's Date: 08/07/2016   History of Present Illness  Pt is a 65 yo female admitted on 08/06/16 for L THA following fall and failed L hemiarthroplasty s/p 6 months. PMH significant for Anxiety, DDD, Hep A, HTN.   Clinical Impression  Pt presents POD 1 following the above procedure. Prior to admission, pt was living alone and completely independent including taking care of her two dogs and a cat. Pt has a daughter and grand daughter who are available to assist PRN and has neighbors who can assist as needed. Pt would like to return home with HHPT if she is able. With current mobility, PT would recommend SNF placement unless pain is better controlled and pt is able to mobilize more efficiently. Pt requires Mod A for bed mobs and transfers and min A to min guard for gait once on Rw. Pt will benefit from continuing to be seen acutely in order to address the below deficits to assist with a smooth transition to the next venue of care.     Follow Up Recommendations Home health PT;SNF    Equipment Recommendations  Rolling walker with 5" wheels    Recommendations for Other Services       Precautions / Restrictions Precautions Precautions: Posterior Hip;Fall Precaution Booklet Issued: Yes (comment) Precaution Comments: handout given and reviewed with Pt.  Restrictions Weight Bearing Restrictions: Yes LLE Weight Bearing: Touchdown weight bearing      Mobility  Bed Mobility Overal bed mobility: Needs Assistance Bed Mobility: Supine to Sit     Supine to sit: Mod assist;HOB elevated     General bed mobility comments: Mod A and slow movement to bring LLE EOB due to increased pain with movement  Transfers Overall transfer level: Needs assistance Equipment used: Rolling walker (2 wheeled) Transfers: Sit to/from Stand Sit to Stand: Mod assist         General transfer comment: Mod A from EOB  with cues for hand placement on bed when standing  Ambulation/Gait Ambulation/Gait assistance: Min assist Ambulation Distance (Feet): 15 Feet Assistive device: Rolling walker (2 wheeled) Gait Pattern/deviations: Step-to pattern;Decreased weight shift to left;Antalgic Gait velocity: decreased Gait velocity interpretation: Below normal speed for age/gender General Gait Details: Mod antalgic gait with increased c/o pain with mobility.   Stairs            Wheelchair Mobility    Modified Rankin (Stroke Patients Only)       Balance Overall balance assessment: Needs assistance Sitting-balance support: No upper extremity supported;Feet supported Sitting balance-Leahy Scale: Fair Sitting balance - Comments: sitting EOB with no back support   Standing balance support: Bilateral upper extremity supported Standing balance-Leahy Scale: Poor Standing balance comment: relies on RW for stability                             Pertinent Vitals/Pain Pain Assessment: 0-10 Pain Score: 9  Pain Location: left hip Pain Descriptors / Indicators: Burning;Stabbing Pain Intervention(s): Limited activity within patient's tolerance;Monitored during session;Repositioned    Home Living Family/patient expects to be discharged to:: Private residence Living Arrangements: Alone;Other (Comment) Available Help at Discharge: Family;Neighbor;Available PRN/intermittently (daughter and granddaughter and neighbors) Type of Home: House Home Access: Stairs to enter Entrance Stairs-Rails: Right Entrance Stairs-Number of Steps: 2 Home Layout: One level Home Equipment: Walker - 2 wheels;Bedside commode;Shower seat;Grab bars - tub/shower;Other (comment);Walker - 4 wheels (suction cup grab bars)  Prior Function Level of Independence: Independent         Comments: was completely independent, fed and gave water to her dogs, was driving prior to surgery, drives SUV     Hand Dominance    Dominant Hand: Right    Extremity/Trunk Assessment   Upper Extremity Assessment Upper Extremity Assessment: Defer to OT evaluation    Lower Extremity Assessment Lower Extremity Assessment: LLE deficits/detail LLE Deficits / Details: pt with normal post op pain and weakness. A  least 3/5 ankle and 2/5 knee and hip per gross functional assessment       Communication   Communication: No difficulties  Cognition Arousal/Alertness: Awake/alert Behavior During Therapy: WFL for tasks assessed/performed Overall Cognitive Status: Within Functional Limits for tasks assessed                 General Comments: Tangential at times    General Comments General comments (skin integrity, edema, etc.): pt takes a long time to tolerate bringing her LLE to the floor initially. Requires slow movement due to increased pain at 9-10/10.    Exercises     Assessment/Plan    PT Assessment Patient needs continued PT services  PT Problem List Decreased strength;Decreased activity tolerance;Decreased range of motion;Decreased balance;Decreased mobility;Decreased knowledge of use of DME;Pain          PT Treatment Interventions DME instruction;Gait training;Stair training;Functional mobility training;Therapeutic activities;Therapeutic exercise;Patient/family education    PT Goals (Current goals can be found in the Care Plan section)  Acute Rehab PT Goals Patient Stated Goal: to go home PT Goal Formulation: With patient Time For Goal Achievement: 08/14/16 Potential to Achieve Goals: Good    Frequency Min 5X/week   Barriers to discharge        Co-evaluation               End of Session Equipment Utilized During Treatment: Gait belt Activity Tolerance: Patient limited by pain Patient left: in chair;with call bell/phone within reach Nurse Communication: Patient requests pain meds;Mobility status;Precautions;Weight bearing status         Time: 0902-0953 PT Time Calculation (min)  (ACUTE ONLY): 51 min   Charges:   PT Evaluation $PT Eval Moderate Complexity: 1 Procedure PT Treatments $Therapeutic Activity: 23-37 mins   PT G Codes:        Scheryl Marten PT, DPT  [phone]   08/07/2016, 12:36 PM

## 2016-08-07 NOTE — NC FL2 (Signed)
[place] LEVEL OF CARE SCREENING TOOL     IDENTIFICATION  Patient Name: Nicole Soto Birthdate: January 27, 1951 Sex: female Admission Date (Current Location): 08/05/2016  [facility] and [place] Number:  Herbalist and Address:  [facility], [place] [address], [place], [place] [address]      Provider Number: [id number]  Attending Physician Name and Address:  Domenic Polite, MD  Relative Name and Phone Number:       Current Level of Care: Hospital Recommended Level of Care: [place] Prior Approval Number:    Date Approved/Denied:   PASRR Number:    Discharge Plan: SNF    Current Diagnoses: Patient Active Problem List   Diagnosis Date Noted  . Hip fracture ([place]) 08/05/2016  . Hypercoagulopathy ([place]) 08/05/2016  . Malnutrition of moderate degree 01/27/2016  . Intertrochanteric fracture of left hip ([place]) 01/26/2016  . Essential hypertension 01/26/2016  . Generalized anxiety disorder 01/26/2016  . Multiple thyroid nodules 01/26/2016  . Encephalomalacia on imaging study 01/26/2016  . Closed left hip fracture ([place]) 01/26/2016    Orientation RESPIRATION BLADDER Height & Weight     Self, Time, Situation, Place  O2 (Nasal Cannula, 2L) Continent Weight: 112 lb (50.8 kg) Height:  5\' 4"  (162.6 cm)  BEHAVIORAL SYMPTOMS/MOOD NEUROLOGICAL BOWEL NUTRITION STATUS      Continent  (Please see d/c summary)  AMBULATORY STATUS COMMUNICATION OF NEEDS Skin   Limited Assist Verbally Surgical wounds (Closed incision left hip, Hydrocolloid dressing.  Closed incision left thigh, Hydrocolloid dressing.)                       Personal Care Assistance Level of Assistance  Bathing, Feeding, Dressing Bathing Assistance: Limited assistance Feeding assistance: Independent Dressing Assistance: Limited assistance     Functional Limitations Info  Sight, Hearing, Speech Sight Info: Adequate Hearing Info: Adequate Speech Info: Adequate     SPECIAL CARE FACTORS FREQUENCY  PT (By licensed PT), OT (By licensed OT)     PT Frequency: min 5x week OT Frequency: min 5x week            Contractures Contractures Info: Not present    Additional Factors Info  Code Status, Allergies, Psychotropic Code Status Info: Full Allergies Info: Penicillins, Shrimp Shellfish Allergy, Tape Psychotropic Info: ALPRAZolam (XANAX) tablet 0.5 mg, 4 times daily.         Current Medications (08/07/2016):  This is the current hospital active medication list Current Facility-Administered Medications  Medication Dose Route Frequency Provider Last Rate Last Dose  . acetaminophen (TYLENOL) tablet 650 mg  650 mg Oral Q6H PRN Charna Elizabeth Martensen III, PA-C       Or  . acetaminophen (TYLENOL) suppository 650 mg  650 mg Rectal Q6H PRN Charna Elizabeth Martensen III, PA-C      . ALPRAZolam Duanne Moron) tablet 0.5 mg  0.5 mg Oral QID PRN Domenic Polite, MD   0.5 mg at 08/07/16 1515  . benazepril (LOTENSIN) tablet 20 mg  20 mg Oral Daily Lezlie Octave Black, NP      . docusate sodium (COLACE) capsule 100 mg  100 mg Oral BID Lezlie Octave Black, NP   100 mg at 08/07/16 0830  . doxycycline (VIBRA-TABS) tablet 100 mg  100 mg Oral Q12H Charna Elizabeth Martensen III, PA-C   100 mg at 08/07/16 0830  . enoxaparin (LOVENOX) injection 40 mg  40 mg Subcutaneous Q24H Charna Elizabeth Martensen III, PA-C   40 mg  at 08/07/16 0831  . feeding supplement (ENSURE ENLIVE) (ENSURE ENLIVE) liquid 237 mL  237 mL Oral BID BM Radene Gunning, NP   237 mL at 08/07/16 1332  . lactated ringers infusion   Intravenous Continuous Charna Elizabeth Martensen III, PA-C 75 mL/hr at 08/06/16 2302    . methocarbamol (ROBAXIN) tablet 500 mg  500 mg Oral Q6H PRN Radene Gunning, NP   500 mg at 08/07/16 1011   Or  . methocarbamol (ROBAXIN) 500 mg in dextrose 5 % 50 mL IVPB  500 mg Intravenous Q6H PRN Radene Gunning, NP      . morphine 2 MG/ML injection 0.5 mg  0.5 mg Intravenous Q2H PRN Radene Gunning, NP   0.5 mg at  08/07/16 1003  . ondansetron (ZOFRAN) tablet 4 mg  4 mg Oral Q8H PRN Radene Gunning, NP      . oxyCODONE-acetaminophen (PERCOCET/ROXICET) 5-325 MG per tablet 1-2 tablet  1-2 tablet Oral Q4H PRN Radene Gunning, NP   2 tablet at 08/07/16 1515  . PARoxetine (PAXIL) tablet 10 mg  10 mg Oral Daily Domenic Polite, MD   10 mg at 08/07/16 0830  . senna (SENOKOT) tablet 8.6 mg  1 tablet Oral BID Charna Elizabeth Martensen III, PA-C   8.6 mg at 08/07/16 0831  . tranexamic acid (CYKLOKAPRON) 2,000 mg in sodium chloride 0.9 % 50 mL Topical Application  123XX123 mg Topical Once Domenic Polite, MD         Discharge Medications: Please see discharge summary for a list of discharge medications.  Relevant Imaging Results:  Relevant Lab Results:   Additional Information SSN: [id number]   Ht: 5\' 4"  (1.626 m) Wt: 112 lb (50.8 kg)   Stevon Gough A [place], LCSW

## 2016-08-07 NOTE — Evaluation (Signed)
Occupational Therapy Evaluation Patient Details Name: Avory Platero MRN: [id number] DOB: Dec 08, 1950 Today's Date: 08/07/2016    History of Present Illness Pt is a 65 yo female admitted on 08/06/16 for L THA following fall and failed L hemiarthroplasty s/p 6 months. PMH significant for Anxiety, DDD, Hep A, HTN.    Clinical Impression   PTA, pt reports independence with ADL and functional mobility. Pt currently requires max-total assist for LB ADL and min assist for functional toilet transfers. Pt lives alone but reports that family and neighbors will be able to provide assistance PRN. Due to decreased independence with ADL and functional mobility, currently recommend short-term SNF placement for continued OT services. Pt would very much prefer to return home but feel will need to improve independence in order to be safe to do so. Educated pt on posterior hip precautions during ADL and safe use of DME for ADL transfers. OT will continue to follow acutely.    Follow Up Recommendations  SNF;Supervision/Assistance - 24 hour    Equipment Recommendations  Other (comment) (TBD at next venue of care)    Recommendations for Other Services       Precautions / Restrictions Restrictions Weight Bearing Restrictions: Yes LLE Weight Bearing: Touchdown weight bearing      Mobility Bed Mobility Overal bed mobility: Needs Assistance Bed Mobility: Supine to Sit     Supine to sit: Mod assist;HOB elevated     General bed mobility comments: Mod A and slow movement to bring LLE EOB due to increased pain with movement  Transfers Overall transfer level: Needs assistance Equipment used: Rolling walker (2 wheeled) Transfers: Sit to/from Stand Sit to Stand: Mod assist         General transfer comment: Mod A from EOB with cues for hand placement on bed when standing    Balance Overall balance assessment: Needs assistance Sitting-balance support: No upper extremity supported;Feet  supported Sitting balance-Leahy Scale: Fair Sitting balance - Comments: sitting EOB with no back support   Standing balance support: Bilateral upper extremity supported Standing balance-Leahy Scale: Poor Standing balance comment: relies on RW for stability                            ADL Overall ADL's : Needs assistance/impaired     Grooming: Set up;Sitting   Upper Body Bathing: Set up;Sitting   Lower Body Bathing: Maximal assistance;Sit to/from stand;Total assistance;Adhering to hip precautions   Upper Body Dressing : Set up;Sitting   Lower Body Dressing: Maximal assistance;Total assistance;Sit to/from stand;Adhering to hip precautions   Toilet Transfer: Minimal assistance;Ambulation;BSC;Cueing for safety;Cueing for sequencing   Toileting- Clothing Manipulation and Hygiene: Minimal assistance;Sit to/from stand       Functional mobility during ADLs: Minimal assistance;Rolling walker General ADL Comments: Pt and daughter educated on posterior hip precautions and impact on ADL as well as use of AE for LB ADL. Pt focused on going home throughout session.      Vision Vision Assessment?: No apparent visual deficits   Perception     Praxis      Pertinent Vitals/Pain Pain Assessment: 0-10 Pain Score: 9  Pain Location: left hip Pain Descriptors / Indicators: Burning;Stabbing Pain Intervention(s): Limited activity within patient's tolerance;Monitored during session;Repositioned     Hand Dominance Right   Extremity/Trunk Assessment Upper Extremity Assessment Upper Extremity Assessment: Defer to OT evaluation   Lower Extremity Assessment Lower Extremity Assessment: LLE deficits/detail LLE Deficits / Details: pt with normal post op  pain and weakness. A  least 3/5 ankle and 2/5 knee and hip per gross functional assessment       Communication Communication Communication: No difficulties   Cognition Arousal/Alertness: Awake/alert Behavior During Therapy: WFL  for tasks assessed/performed Overall Cognitive Status: Within Functional Limits for tasks assessed                 General Comments: Tangential at times   General Comments       Exercises       Shoulder Instructions      Home Living Family/patient expects to be discharged to:: Private residence Living Arrangements: Alone;Other (Comment) (has two dogs that stay outside, 1 cat in the house) Available Help at Discharge: Family;Neighbor;Available PRN/intermittently (daughter and granddaughter and neighbors) Type of Home: House Home Access: Stairs to enter Technical brewer of Steps: 2 Entrance Stairs-Rails: Right Home Layout: One level     Bathroom Shower/Tub: Tub/shower unit Shower/tub characteristics: Curtain Biochemist, clinical: Standard     Home Equipment: Environmental consultant - 2 wheels;Bedside commode;Shower seat;Grab bars - tub/shower;Other (comment);Walker - 4 wheels (suction cup grab bars)   Additional Comments: Reports having lots of rugs but these are "taped" down.      Prior Functioning/Environment Level of Independence: Independent        Comments: was completely independent, fed and gave water to her dogs, was driving prior to surgery, drives SUV        OT Problem List: Decreased strength;Decreased range of motion;Decreased activity tolerance;Impaired balance (sitting and/or standing);Decreased safety awareness;Decreased knowledge of use of DME or AE;Decreased knowledge of precautions;Pain   OT Treatment/Interventions: Self-care/ADL training;Therapeutic exercise;Therapeutic activities;Patient/family education;Balance training;DME and/or AE instruction    OT Goals(Current goals can be found in the care plan section) Acute Rehab OT Goals Patient Stated Goal: to go home OT Goal Formulation: With patient/family Time For Goal Achievement: 08/14/16 Potential to Achieve Goals: Good ADL Goals Pt Will Perform Lower Body Bathing: with modified independence;sit to/from  stand;with adaptive equipment Pt Will Perform Lower Body Dressing: with modified independence;sit to/from stand;with adaptive equipment Pt Will Transfer to Toilet: with modified independence;ambulating;bedside commode Pt Will Perform Toileting - Clothing Manipulation and hygiene: with modified independence;sit to/from stand Pt Will Perform Tub/Shower Transfer: with modified independence;Tub transfer;tub bench;3 in 1;shower seat;ambulating;rolling walker Pt/caregiver will Perform Home Exercise Program: Increased strength;Both right and left upper extremity;With written HEP provided;With Supervision  OT Frequency: Min 2X/week   Barriers to D/C:            Co-evaluation              End of Session Equipment Utilized During Treatment: Gait belt;Rolling walker  Activity Tolerance: Patient tolerated treatment well Patient left: in chair;with call bell/phone within reach   Time: 1216-1250 OT Time Calculation (min): 34 min Charges:  OT General Charges $OT Visit: 1 Procedure OT Evaluation $OT Eval Moderate Complexity: 1 Procedure OT Treatments $Self Care/Home Management : 8-22 mins  Norman Herrlich, OTR/L [id number] 08/07/2016, 1:10 PM

## 2016-08-07 NOTE — Clinical Social Work Note (Signed)
Clinical Social Work Assessment  Patient Details  Name: Nicole Soto MRN: [id number] Date of Birth: 02/02/1951  Date of referral:  08/07/16               Reason for consult:  Facility Placement                Permission sought to share information with:  Family Supports Permission granted to share information::  Yes, Verbal Permission Granted  Name::     Animal nutritionist::     Relationship::  Daughter  Contact Information:  Cell: [phone], Work: [phone]  Housing/Transportation Living arrangements for the past 2 months:  [place] of Information:  Patient Patient Interpreter Needed:  None Criminal Activity/Legal Involvement Pertinent to Current Situation/Hospitalization:  No - Comment as needed Significant Relationships:  Adult Children Lives with:  Self Do you feel safe going back to the place where you live?  Yes Need for family participation in patient care:  Yes (Comment)  Care giving concerns:  Pt's daughter Juliann Pulse was at bedside during initial assessment. Pt's daughter will assist in pt's care once pt returns home. Pt's daughter request that CSW contact her (pt verbally agreed) for updates on pt's treatment plan and disposition once determined. Pt's daughters contact information: (c) [phone], (w) [phone].    Social Worker assessment / plan:  CSW spoke with pt at bedside to complete initial assessment. CSW was consulted for SNF placement. Pt's daughter was present at bedside during initial assessment. Pt verbalized permission for CSW to speak with pt in front of pt's daughter. Pt reports her and her daughter just started getting along this past Christmas. Pt lives at home alone. Pt reports her daughter will assisting in her care. Pt reports her daughter is a recovering addict. Pt's daughter reports she will be staying with her mom during the evenings and pt's grandautgher will be with the pt during the day. Pt wants to go home at d/c. CSW spoke  with pt regarding pain management and the importance of a facility providing care to assist in providing her pain medication. Pt has been to [facility] in [place] previously. Pt is a [place] resident. Pt reports she want's to go home however if she has to go to SNF she would prefer [facility]. Pt reports multiple times she want's to go home and that her daughter will be able to assist her with all of her needs. CSW spoke with PT and PT will reevaluate pt tomorrow 12/30 to determine recommendations. PT is concerned regarding pt's pain management. Pt reported to CSW that pt's daughter can assist in pain management, however, pt's daughter seemed resistant. Pt's daughter reports it would be good for the Pt to get pain management from professionals. Pt's daughter ask that CSW call her tomorrow to update her on plan. CSW will reach out to [facility] for back up in the instance that pt needs SNF.  Employment status:  Retired Nurse, adult PT Recommendations:  [place] / Referral to community resources:  [place]  Patient/Family's Response to care:  Pt verbalized understanding of CSW role and appreciation of support. Pt was asking for pain medication as well as Xanax when CSW was completing initial assessment. CSW reached out to Rn. Pt reports the hospital has been "great." Pt was appreciative of care from every provider.  Patient/Family's Understanding of and Emotional Response to Diagnosis, Current Treatment, and Prognosis:  Pt some what understanding and realistic  regarding current physical limitations. Pt reports she has been walking around at home on a broken hip for three months therefore, pt doesn't understand why her physical limitations would be an issue at home. Pt reports her daughter and granddaughter were having to physically pick the pt up to move her about the house. CSW explained the importance of physical therapy to help heal the  right way. CSW also explained the need for pain management given this was a second surgery on the same hip. Pt prefers to go home but reports she will consider SNF if absolutely needed however, pt reports daughter can manage pain meds at home for pt. Pt's daughter was resist to this idea. Pt daughter is a recovery addict therefore this may cause conflict. CSW will prepare for SNF placement and continue to follow.  Emotional Assessment Appearance:  Appears stated age Attitude/Demeanor/Rapport:  Other (Patient was appropriate) Affect (typically observed):  Accepting, Appropriate, Anxious Orientation:  Oriented to Self, Oriented to Place, Oriented to  Time, Oriented to Situation Alcohol / Substance use:  Not Applicable Psych involvement (Current and /or in the community):  No (Comment)  Discharge Needs  Concerns to be addressed:  Care Coordination Readmission within the last 30 days:  No Current discharge risk:  Dependent with Mobility Barriers to Discharge:  Continued Medical Work up   [employer], LCSW 08/07/2016, 3:23 PM

## 2016-08-07 NOTE — Progress Notes (Addendum)
   Assessment: 1 Day Post-Op  S/P Procedure(s) (LRB): HARDWARE REMOVAL LEFT HIP ARTHROPLASTY BIPOLAR HIP (HEMIARTHROPLASTY) (Left) HARDWARE REMOVAL (Left) by Dr. Ernesta Amble. Murphy on 08/06/16  Doing well POD 1 s/p removal of broken left IM Nail, left hip hemiarthroplasty.  Not oob yet.  Principal Problem:   Hip fracture ([place]) Active Problems:   Essential hypertension   Generalized anxiety disorder   Malnutrition of moderate degree   Hypercoagulopathy (HCC) Acute Blood loss anemia due to surgical loss - improved s/p intra op transfusions.  H/H 11.2/33.5  Plan: PT / OT Posterior hip precautions Advance diet Continue PO abx and follow intra op cx.  Currently on Doxy BID.  PCN Allergy.  Failed to heal fx, bute Low pre-op clinical suspicion for infection.    Weight Bearing: Touch Down Weight Bearing (TDWB) left leg Dressings: Mepilex prn.  VTE prophylaxis: Lovenox, SCDs, ambulation Dispo: Pending PT / OT eval.  Previously went to [facility].  Subjective: Patient reports pain as moderate. Pain controlled with IV and PO meds and robaxin.  Tolerating liquids.  Urinating.  No CP, SOB. Not yet OOB.  Objective:   VITALS:   Vitals:   08/06/16 2200 08/06/16 2220 08/07/16 0012 08/07/16 0428  BP:  127/62 131/61 127/76  Pulse:  96 98 95  Resp:  20 18 18   Temp: 97.5 F (36.4 C) 97.6 F (36.4 C) 97.7 F (36.5 C) 97.7 F (36.5 C)  TempSrc:  Oral Oral Oral  SpO2:  97% 99% 98%  Weight:      Height:       CBC Latest Ref Rng & Units 08/06/2016 08/06/2016 08/05/2016  WBC 4.0 - 10.5 K/uL 23.2(H) 8.5 10.1  Hemoglobin 12.0 - 15.0 g/dL 11.2(L) 10.0(L) 10.9(L)  Hematocrit 36.0 - 46.0 % 33.5(L) 31.3(L) 33.7(L)  Platelets 150 - 400 K/uL 300 449(H) 495(H)   BMP Latest Ref Rng & Units 08/06/2016 08/05/2016 01/29/2016  Glucose 65 - 99 mg/dL 77 78 90  BUN 6 - 20 mg/dL 21(H) 16 18  Creatinine 0.44 - 1.00 mg/dL 0.60 0.52 0.63  Sodium 135 - 145 mmol/L 138 139 142  Potassium 3.5 - 5.1  mmol/L 3.8 3.7 3.9  Chloride 101 - 111 mmol/L 103 107 111  CO2 22 - 32 mmol/L 28 26 25   Calcium 8.9 - 10.3 mg/dL 8.7(L) 9.2 8.3(L)   Intake/Output      12/28 0701 - 12/29 0700 12/29 0701 - 12/30 0700   P.O. 0    I.V. (mL/kg) 2400 (47.2)    Blood 947    IV Piggyback 250    Total Intake(mL/kg) 3597 (70.8)    Urine (mL/kg/hr) 1600 (1.3)    Blood 1600 (1.3)    Total Output 3200     Net +397          Urine Occurrence 3 x      Physical Exam: General: NAD.  Supine in bed Resp: No increased wob.  Lungs clear at bases.  IS at bedside. Cardio: regular rate and rhythm ABD soft Neurologically intact MSK Left LE: Neurovascularly intact Sensation intact distally Foot warm Dorsiflexion/Plantar flexion intact Incision: dressing C/D/I   Nicole Soto 08/07/2016, 7:30 AM

## 2016-08-08 LAB — BASIC METABOLIC PANEL
Anion gap: 10 (ref 5–15)
BUN: 17 mg/dL (ref 6–20)
CHLORIDE: 104 mmol/L (ref 101–111)
CO2: 22 mmol/L (ref 22–32)
CREATININE: 0.73 mg/dL (ref 0.44–1.00)
Calcium: 8.2 mg/dL — ABNORMAL LOW (ref 8.9–10.3)
GFR calc Af Amer: >60 mL/min (ref >60)
GFR calc non Af Amer: >60 mL/min (ref >60)
Glucose, Bld: 134 mg/dL — ABNORMAL HIGH (ref 65–99)
Potassium: 3.6 mmol/L (ref 3.5–5.1)
SODIUM: 136 mmol/L (ref 135–145)

## 2016-08-08 LAB — CBC
HCT: 25.3 % — ABNORMAL LOW (ref 36.0–46.0)
Hemoglobin: 8.4 g/dL — ABNORMAL LOW (ref 12.0–15.0)
MCH: 31.6 pg (ref 26.0–34.0)
MCHC: 33.2 g/dL (ref 30.0–36.0)
MCV: 95.1 fL (ref 78.0–100.0)
Platelets: 255 10*3/uL (ref 150–400)
RBC: 2.66 MIL/uL — ABNORMAL LOW (ref 3.87–5.11)
RDW: 16.2 % — AB (ref 11.5–15.5)
WBC: 15.2 10*3/uL — AB (ref 4.0–10.5)

## 2016-08-08 NOTE — Progress Notes (Signed)
Subjective: 2 Days Post-Op Procedure(s) (LRB): HARDWARE REMOVAL LEFT HIP ARTHROPLASTY BIPOLAR HIP (HEMIARTHROPLASTY) (Left) HARDWARE REMOVAL (Left) Patient reports pain as mild and moderate.    Objective: Vital signs in last 24 hours: Temp:  [98.1 F (36.7 C)-98.8 F (37.1 C)] 98.1 F (36.7 C) (12/30 0505) Pulse Rate:  [90-97] 92 (12/30 0505) Resp:  [18-20] 18 (12/30 0505) BP: (100-114)/(33-53) 111/50 (12/30 1000) SpO2:  [97 %-100 %] 100 % (12/30 0505)  Intake/Output from previous day: 12/29 0701 - 12/30 0700 In: 2082.5 [P.O.:960; I.V.:1122.5] Out: 1000 [Urine:1000] Intake/Output this shift: Total I/O In: 240 [P.O.:240] Out: -    Recent Labs  08/05/16 1142 08/06/16 0427 08/06/16 2112 08/07/16 0821 08/08/16 0553  HGB 10.9* 10.0* 11.2* 9.4* 8.4*    Recent Labs  08/07/16 0821 08/08/16 0553  WBC 12.2* 15.2*  RBC 2.98* 2.66*  HCT 28.2* 25.3*  PLT 276 255    Recent Labs  08/06/16 0427 08/07/16 2334  NA 138 136  K 3.8 3.6  CL 103 104  CO2 28 22  BUN 21* 17  CREATININE 0.60 0.73  GLUCOSE 77 134*  CALCIUM 8.7* 8.2*    Recent Labs  08/05/16 1142 08/06/16 0427  INR >10.00* 1.05    Neurovascular intact Sensation intact distally Intact pulses distally Dorsiflexion/Plantar flexion intact Incision: dressing C/D/I  Assessment/Plan: 2 Days Post-Op Procedure(s) (LRB): HARDWARE REMOVAL LEFT HIP ARTHROPLASTY BIPOLAR HIP (HEMIARTHROPLASTY) (Left) HARDWARE REMOVAL (Left) Up with therapy  WBAT LLE Post hip precautions dsg change prn May d/c home with HHPT from ortho standpoint, she states she has help at home as well as the DME equipment needed  Rx in chart from ortho, f/u within 2 weeks Dr. Lavada Mesi, Vonna Kotyk 08/08/2016, 10:55 AM

## 2016-08-08 NOTE — Progress Notes (Signed)
PROGRESS NOTE    Nicole Soto  [id number] DOB: 11/03/1950 DOA: 08/05/2016 PCP: Neale Burly, MD  Brief Narrative:Nicole Soto is a 65 y.o. female with medical history significant hypertension, anxiety, tobacco use who presented with failure of hardware from previous hip fracture.  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 -Ortho consulting, s/p HARDWARE REMOVAL LEFT HIP ARTHROPLASTY BIPOLAR HIP (HEMIARTHROPLASTY) 12/28: required 3units PRBC for acute blood loss -Pt following, doesn't want rehab, plans home with HHPT -Will plan DC home tomorrow after Therapy  Acute blood loss anemia -s/p 3units PRBC -intraop    Essential hypertension -hold HCTZ, continue benazepril    Generalized anxiety disorder -resumed xanax and paxil    Malnutrition of moderate degree -supplements as tolerated    Hypercoagulopathy ([place]) -likely lab error, repeat normal  DVT prophylaxis: SCDs, start lovenox today Code Status:Full Code Family Communication:None at bedside Disposition Plan:Will likely need SNF   Consultants:   Ortho   Subjective: Feels ok, some hip pain  Objective: Vitals:   08/07/16 1351 08/07/16 2030 08/08/16 0505 08/08/16 1000  BP: (!) 108/53 (!) 100/53 (!) 114/33 (!) 111/50  Pulse: 97 90 92   Resp: 18 20 18    Temp: 98.4 F (36.9 C) 98.8 F (37.1 C) 98.1 F (36.7 C)   TempSrc: Oral Oral Oral   SpO2: 97% 98% 100%   Weight:      Height:        Intake/Output Summary (Last 24 hours) at 08/08/16 1159 Last data filed at 08/08/16 1100  Gross per 24 hour  Intake              945 ml  Output             1400 ml  Net             -455 ml   Filed Weights   08/06/16 1456  Weight: 50.8 kg (112 lb)    Examination:  General exam: Appears calm and comfortable  Respiratory system: Clear to auscultation. Respiratory effort normal. Cardiovascular system: S1 & S2 heard, RRR. No JVD, murmurs, rubs, gallops or clicks. No pedal  edema. Gastrointestinal system: Abdomen is nondistended, soft and nontender. Normal bowel sounds heard. Central nervous system: Alert and oriented. No focal neurological deficits. Extremities: L hip incision unremarkable Skin: No rashes, lesions or ulcers Psychiatry: Judgement and insight appear normal. Mood & affect appropriate.     Data Reviewed: I have personally reviewed following labs and imaging studies  CBC:  Recent Labs Lab 08/05/16 1142 08/06/16 0427 08/06/16 2112 08/07/16 0821 08/08/16 0553  WBC 10.1 8.5 23.2* 12.2* 15.2*  NEUTROABS 6.1  --   --   --   --   HGB 10.9* 10.0* 11.2* 9.4* 8.4*  HCT 33.7* 31.3* 33.5* 28.2* 25.3*  MCV 101.8* 101.6* 95.7 94.6 95.1  PLT 495* 449* 300 276 [id number]   Basic Metabolic Panel:  Recent Labs Lab 08/05/16 1142 08/06/16 0427 08/07/16 2334  NA 139 138 136  K 3.7 3.8 3.6  CL 107 103 104  CO2 26 28 22   GLUCOSE 78 77 134*  BUN 16 21* 17  CREATININE 0.52 0.60 0.73  CALCIUM 9.2 8.7* 8.2*   GFR: Estimated Creatinine Clearance: 54.7 mL/min (by C-G formula based on SCr of 0.73 mg/dL). Liver Function Tests:  Recent Labs Lab 08/05/16 1142  AST 17  ALT 10*  ALKPHOS 94  BILITOT 0.4  PROT 6.7  ALBUMIN 3.4*  No results for input(s): LIPASE, AMYLASE in the last 168 hours. No results for input(s): AMMONIA in the last 168 hours. Coagulation Profile:  Recent Labs Lab 08/05/16 1142 08/06/16 0427  INR >10.00* 1.05   Cardiac Enzymes: No results for input(s): CKTOTAL, CKMB, CKMBINDEX, TROPONINI in the last 168 hours. BNP (last 3 results) No results for input(s): PROBNP in the last 8760 hours. HbA1C: No results for input(s): HGBA1C in the last 72 hours. CBG: No results for input(s): GLUCAP in the last 168 hours. Lipid Profile: No results for input(s): CHOL, HDL, LDLCALC, TRIG, CHOLHDL, LDLDIRECT in the last 72 hours. Thyroid Function Tests: No results for input(s): TSH, T4TOTAL, FREET4, T3FREE, THYROIDAB in the last 72  hours. Anemia Panel: No results for input(s): VITAMINB12, FOLATE, FERRITIN, TIBC, IRON, RETICCTPCT in the last 72 hours. Urine analysis: No results found for: COLORURINE, APPEARANCEUR, LABSPEC, PHURINE, GLUCOSEU, HGBUR, BILIRUBINUR, KETONESUR, PROTEINUR, UROBILINOGEN, NITRITE, LEUKOCYTESUR Sepsis Labs: @LABRCNTIP (procalcitonin:4,lacticidven:4)  ) Recent Results (from the past 240 hour(s))  Surgical PCR screen     Status: None   Collection Time: 08/05/16  2:03 PM  Result Value Ref Range Status   MRSA, PCR NEGATIVE NEGATIVE Final   Staphylococcus aureus NEGATIVE NEGATIVE Final    Comment:        The Xpert SA Assay (FDA approved for NASAL specimens in patients over 23 years of age), is one component of a comprehensive surveillance program.  Test performance has been validated by [facility] for patients greater than or equal to 26 year old. It is not intended to diagnose infection nor to guide or monitor treatment.   Aerobic/Anaerobic Culture (surgical/deep wound)     Status: None (Preliminary result)   Collection Time: 08/06/16  5:29 PM  Result Value Ref Range Status   Specimen Description TISSUE LEFT HIP  Final   Special Requests PT ON VANC  Final   Gram Stain   Final    RARE WBC PRESENT, PREDOMINANTLY PMN NO ORGANISMS SEEN    Culture NO GROWTH 1 DAY  Final   Report Status PENDING  Incomplete         Radiology Studies: Dg Hip Port Unilat With Pelvis 1v Left  Result Date: 08/06/2016 CLINICAL DATA:  Left hip fracture. Status post left hip replacement. EXAM: DG HIP (WITH OR WITHOUT PELVIS) 1V PORT LEFT COMPARISON:  01/27/2016 FINDINGS: A left hip hemiarthroplasty with a long intramedullary stem, appears well-seated and well-aligned. There is no acute fracture or evidence of an operative complication. IMPRESSION: Well-positioned left hip hemiarthroplasty. Electronically Signed   By: Lajean Manes M.D.   On: 08/06/2016 21:31        Scheduled Meds: . benazepril  20  mg Oral Daily  . docusate sodium  100 mg Oral BID  . doxycycline  100 mg Oral Q12H  . enoxaparin (LOVENOX) injection  40 mg Subcutaneous Q24H  . feeding supplement (ENSURE ENLIVE)  237 mL Oral BID BM  . PARoxetine  10 mg Oral Daily  . senna  1 tablet Oral BID  . tranexamic acid (CYKLOKAPRON) topical -INTRAOP  2,000 mg Topical Once   Continuous Infusions:    LOS: 3 days    Time spent: 50min    Domenic Polite, MD [facility] Pager [phone]  If 7PM-7AM, please contact night-coverage [email or web address] Password [email or web address] 08/08/2016, 11:59 AM

## 2016-08-08 NOTE — Progress Notes (Signed)
Physical Therapy Treatment Patient Details Name: Nicole Soto MRN: [id number] DOB: [birth year] Today's Date: 08/08/2016    History of Present Illness Pt is a 65 yo female admitted on 08/06/16 for L THA following fall and failed L hemiarthroplasty s/p 6 months. PMH significant for Anxiety, DDD, Hep A, HTN.     PT Comments    Pt presents with improved demeanor and ability to mobilize in bed with decreased assistance. Pt is confident she will be safe to return home with her daughter and grand daughters assistance. Pt plans to manage all her own medications so her daughter will not have to assist, but she will need assistance with bed mobs and gait once she returns home. Pt continues to require HHPT upon discharge in order to maximize her functional outcomes.   Follow Up Recommendations  Home health PT     Equipment Recommendations  Rolling walker with 5" wheels    Recommendations for Other Services       Precautions / Restrictions Precautions Precautions: Posterior Hip;Fall Precaution Booklet Issued: Yes (comment) Precaution Comments: Verbally reviewed precautions  Restrictions Weight Bearing Restrictions: Yes LLE Weight Bearing: Touchdown weight bearing    Mobility  Bed Mobility Overal bed mobility: Needs Assistance Bed Mobility: Supine to Sit     Supine to sit: Min assist     General bed mobility comments: Min A with HOB flat to simulate home environment. Pt still requires assistance to bring LLE EOB  Transfers Overall transfer level: Needs assistance Equipment used: Rolling walker (2 wheeled) Transfers: Sit to/from Stand Sit to Stand: Min guard         General transfer comment: Min guard for safety from EOB to RW  Ambulation/Gait Ambulation/Gait assistance: Min assist Ambulation Distance (Feet): 15 Feet Assistive device: Rolling walker (2 wheeled) Gait Pattern/deviations: Step-to pattern;Decreased weight shift to left;Antalgic Gait velocity: decreased Gait  velocity interpretation: Below normal speed for age/gender General Gait Details: Moderate antalgic gait, good adherence to weight bearing restrictions.    Stairs            Wheelchair Mobility    Modified Rankin (Stroke Patients Only)       Balance                                    Cognition Arousal/Alertness: Awake/alert Behavior During Therapy: WFL for tasks assessed/performed Overall Cognitive Status: Within Functional Limits for tasks assessed                      Exercises Total Joint Exercises Ankle Circles/Pumps: AROM;Both;20 reps;Supine Quad Sets: AROM;Left;10 reps;Supine Short Arc Quad: AROM;Left;10 reps;Supine Heel Slides: Left;10 reps;Supine;AAROM Hip ABduction/ADduction: AAROM;Left;10 reps;Supine    General Comments        Pertinent Vitals/Pain Pain Assessment: 0-10 Pain Score: 8  Pain Location: left hip Pain Descriptors / Indicators: Burning;Stabbing Pain Intervention(s): Monitored during session;Premedicated before session;Repositioned    Home Living                      Prior Function            PT Goals (current goals can now be found in the care plan section) Acute Rehab PT Goals Patient Stated Goal: to go home Progress towards PT goals: Progressing toward goals    Frequency    Min 5X/week      PT Plan Current plan remains appropriate  Co-evaluation             End of Session Equipment Utilized During Treatment: Gait belt Activity Tolerance: Patient limited by pain Patient left: Other (comment);with nursing/sitter in room (on [facility], nursing to assist back to bed)     Time: [id number] PT Time Calculation (min) (ACUTE ONLY): 36 min  Charges:  $Gait Training: 8-22 mins $Therapeutic Exercise: 8-22 mins                    G Codes:      Scheryl Marten PT, DPT  [phone]  08/08/2016, 4:21 PM

## 2016-08-08 NOTE — Progress Notes (Addendum)
Occupational Therapy Treatment Patient Details Name: Nicole Soto MRN: [id number] DOB: 1951/05/15 Today's Date: 08/08/2016    History of present illness Pt is a 65 yo female admitted on 08/06/16 for L THA following fall and failed L hemiarthroplasty s/p 6 months. PMH significant for Anxiety, DDD, Hep A, HTN.    OT comments  Pt. Making gains with skilled OT.  Able to complete multiple sit/stand with amb. And transfers with decreased physical assistance.   Decreased c/o pain than previously documented.  Eager for d/c home and states dtr. And granddaughter will be available to assist.    Follow Up Recommendations  SNF;Supervision/Assistance - 24 hour    Equipment Recommendations       Recommendations for Other Services      Precautions / Restrictions Precautions Precautions: Posterior Hip;Fall Precaution Comments: Verbally reviewed precautions   LLE TDWB      Mobility Bed Mobility               General bed mobility comments: in recliner upon arrival into room  Transfers Overall transfer level: Needs assistance Equipment used: Rolling walker (2 wheeled) Transfers: Sit to/from [facility] Sit to Stand: Min guard Stand pivot transfers: Min guard       General transfer comment: pt. able to manage LLE during transitions from sit/stand, stand/sit.  no lob noted with amb. across room to bsc    Balance                                   ADL Overall ADL's : Needs assistance/impaired                         Toilet Transfer: Min guard;Ambulation;RW;BSC   Toileting- Water quality scientist and Hygiene: Min guard;Sit to/from stand       Functional mobility during ADLs: Min guard;Rolling walker General ADL Comments: noted improvement with functional mobility than previously documented.  pt. with no c/o pain and was able to verbalize all safety strategies for LLE management and integrate during mobility.        Vision                      Perception     Praxis      Cognition   Behavior During Therapy: WFL for tasks assessed/performed Overall Cognitive Status: Within Functional Limits for tasks assessed                       Extremity/Trunk Assessment               Exercises     Shoulder Instructions       General Comments      Pertinent Vitals/ Pain       Pain Assessment: No/denies pain  Home Living                                          Prior Functioning/Environment              Frequency  Min 2X/week        Progress Toward Goals  OT Goals(current goals can now be found in the care plan section)  Progress towards OT goals: Progressing toward goals     Plan Discharge plan remains appropriate  Co-evaluation                 End of Session Equipment Utilized During Treatment: Gait belt;Rolling walker   Activity Tolerance Patient tolerated treatment well   Patient Left in chair;with call bell/phone within reach   Nurse Communication          Time: [id number] OT Time Calculation (min): 12 min  Charges: OT General Charges $OT Visit: 1 Procedure OT Treatments $Self Care/Home Management : 8-22 mins  Janice Coffin, COTA/L 08/08/2016, 11:11 AM

## 2016-08-09 DIAGNOSIS — R262 Difficulty in walking, not elsewhere classified: Secondary | ICD-10-CM

## 2016-08-09 LAB — POCT I-STAT 4, (NA,K, GLUC, HGB,HCT)
GLUCOSE: 105 mg/dL — AB (ref 65–99)
Glucose, Bld: 88 mg/dL (ref 65–99)
HEMATOCRIT: 28 % — AB (ref 36.0–46.0)
HEMATOCRIT: 29 % — AB (ref 36.0–46.0)
HEMOGLOBIN: 9.5 g/dL — AB (ref 12.0–15.0)
Hemoglobin: 9.9 g/dL — ABNORMAL LOW (ref 12.0–15.0)
POTASSIUM: 4.1 mmol/L (ref 3.5–5.1)
Potassium: 3.6 mmol/L (ref 3.5–5.1)
Sodium: 141 mmol/L (ref 135–145)
Sodium: 139 mmol/L (ref 135–145)

## 2016-08-09 MED ORDER — POLYETHYLENE GLYCOL 3350 17 G PO PACK: 17.0000 g | PACK | Freq: Every day | ORAL | 0 refills | Status: AC | PRN

## 2016-08-09 NOTE — Progress Notes (Signed)
Physical Therapy Treatment Patient Details Name: Nicole Soto MRN: [id number] DOB: 1951-06-14 Today's Date: 08/09/2016    History of Present Illness Pt is a 65 yo female admitted on 08/06/16 for L THA following fall and failed L hemiarthroplasty s/p 6 months. PMH significant for Anxiety, DDD, Hep A, HTN.     PT Comments    Pt presents POD 3 and is moving well with therapy. Performed gait x 75' this session with improved weight bearing through LLE during gait and improved heel strike as distance increases. Pt reports and demonstrates adherence to HEP. Able to perform LAQ this session without therapist assistance.    Follow Up Recommendations  Home health PT     Equipment Recommendations  None recommended by PT    Recommendations for Other Services       Precautions / Restrictions Precautions Precautions: Posterior Hip;Fall Precaution Booklet Issued: Yes (comment) Precaution Comments: Verbally reviewed precautions  Restrictions Weight Bearing Restrictions: Yes LLE Weight Bearing: Weight bearing as tolerated    Mobility  Bed Mobility               General bed mobility comments: Recieved in recliner  Transfers Overall transfer level: Needs assistance Equipment used: Rolling walker (2 wheeled) Transfers: Sit to/from Stand Sit to Stand: Min guard         General transfer comment: Min guard for safety from recliner to RW  Ambulation/Gait Ambulation/Gait assistance: Min guard Ambulation Distance (Feet): 75 Feet Assistive device: Rolling walker (2 wheeled) Gait Pattern/deviations: Step-to pattern;Decreased weight shift to left;Antalgic Gait velocity: decreased Gait velocity interpretation: Below normal speed for age/gender General Gait Details: Mild antalgic gait, improved weight bearing through LLE   Stairs            Wheelchair Mobility    Modified Rankin (Stroke Patients Only)       Balance                                     Cognition Arousal/Alertness: Awake/alert Behavior During Therapy: WFL for tasks assessed/performed Overall Cognitive Status: Within Functional Limits for tasks assessed                      Exercises Total Joint Exercises Ankle Circles/Pumps: AROM;Both;20 reps;Supine Quad Sets: AROM;Left;10 reps;Supine Heel Slides: Left;10 reps;Supine;AAROM Hip ABduction/ADduction: AAROM;Left;10 reps;Supine Long Arc Quad: AROM;Left;10 reps;Seated    General Comments        Pertinent Vitals/Pain Pain Assessment: 0-10 Pain Score: 5  Pain Location: left hip Pain Descriptors / Indicators: Burning;Stabbing Pain Intervention(s): Monitored during session;Premedicated before session    Home Living                      Prior Function            PT Goals (current goals can now be found in the care plan section) Acute Rehab PT Goals Patient Stated Goal: to go home Progress towards PT goals: Progressing toward goals    Frequency    Min 5X/week      PT Plan Current plan remains appropriate    Co-evaluation             End of Session Equipment Utilized During Treatment: Gait belt Activity Tolerance: Patient tolerated treatment well Patient left: in chair;with call bell/phone within reach     Time: 1103-1145 PT Time Calculation (min) (ACUTE ONLY): 42 min  Charges:  $  Gait Training: 23-37 mins $Therapeutic Exercise: 8-22 mins                    G Codes:      Scheryl Marten PT, DPT  [phone]  08/09/2016, 12:14 PM

## 2016-08-09 NOTE — Clinical Social Work Note (Addendum)
CSW received referral for SNF.  PT worked with patient and is recommending home health, CSW updated case Freight forwarder.  Jones Broom. Esiquio Boesen, MSW, [employer] Weekend Social Worker [phone]

## 2016-08-09 NOTE — Progress Notes (Signed)
Pt discharged to home in stable condition with no pain and vital signs within normal limits.  Escorted to car via wheelchair.

## 2016-08-09 NOTE — Care Management Note (Signed)
Case Management Note  Patient Details  Name: Nicole Soto MRN: [id number] Date of Birth: 14-Nov-1950  Subjective/Objective:    left Hip fracture               Action/Plan: Discharge Planning: AVS reviewed:  NCM spoke to pt and states HH was arranged when she was dc from [facility]. Did not know name of agency. She has RW and bedside commode at home. Provided pt with NCM contact info to call with information. [facility]# [phone]. NCM spoke with RN at [facility] and [employer] planner is not available. States pt has not been at there facility in months.    Contacted [facility] in [place], and [employer]. Unable to accept referral.    PCP Neale Burly MD   Expected Discharge Date:  08/09/2016             Expected Discharge Plan:  [place]  In-House Referral:  Clinical Social Work  Discharge planning Services  CM Consult  Post Acute Care Choice:  Home Health Choice offered to:  Patient  DME Arranged:  N/A DME Agency:  NA  HH Arranged:  PT [place] Agency:  Other - See comment  Status of Service:  Completed, signed off  If discussed at [place] of Stay Meetings, dates discussed:    Additional Comments:  Erenest Rasher, RN 08/09/2016, 5:09 PM

## 2016-08-09 NOTE — Progress Notes (Signed)
Health teaching done regarding lovenox injection 40mg  pt fully understood lesson and feels she will not have a problem with this injection

## 2016-08-09 NOTE — Progress Notes (Signed)
Physical Therapy Treatment Patient Details Name: Nicole Soto MRN: [id number] DOB: Dec 15, 1950 Today's Date: 08/09/2016    History of Present Illness Pt is a 65 yo female admitted on 08/06/16 for L THA following fall and failed L hemiarthroplasty s/p 6 months. PMH significant for Anxiety, DDD, Hep A, HTN.     PT Comments    Pt continues to move well with therapy this session. Performed stair training this session. Pt is able to ascend and descend 2 steps with min guard assist with use of 1 railing on right. Pt prefers going opposite sequence of what is recommended and reports do so at home. Pt is motivated to improve and increases gait distance daily. Complete independence with exercises in HEP noted.    Follow Up Recommendations  Home health PT     Equipment Recommendations  None recommended by PT    Recommendations for Other Services       Precautions / Restrictions Precautions Precautions: Posterior Hip;Fall Precaution Booklet Issued: Yes (comment) Precaution Comments: Verbally reviewed precautions  Restrictions Weight Bearing Restrictions: Yes LLE Weight Bearing: Weight bearing as tolerated    Mobility  Bed Mobility Overal bed mobility: Needs Assistance Bed Mobility: Supine to Sit     Supine to sit: Min guard     General bed mobility comments: Min guard for safety to EOB  Transfers Overall transfer level: Needs assistance Equipment used: Rolling walker (2 wheeled) Transfers: Sit to/from [facility] Sit to Stand: Min guard Stand pivot transfers: Min guard       General transfer comment: Min guard for safety from recliner to RW  Ambulation/Gait Ambulation/Gait assistance: Min guard Ambulation Distance (Feet): 75 Feet Assistive device: Rolling walker (2 wheeled) Gait Pattern/deviations: Step-to pattern;Decreased step length - right;Decreased stance time - left;Antalgic Gait velocity: decreased Gait velocity interpretation: Below normal  speed for age/gender General Gait Details: Mild antalgic gait, improved weight bearing through LLE   Stairs Stairs: Yes   Stair Management: One rail Right;Step to pattern;Forwards Number of Stairs: 2 General stair comments: Cues for sequencing, pt prefers opposite of recommended pattern.   Wheelchair Mobility    Modified Rankin (Stroke Patients Only)       Balance                                    Cognition Arousal/Alertness: Awake/alert Behavior During Therapy: WFL for tasks assessed/performed Overall Cognitive Status: Within Functional Limits for tasks assessed                      Exercises Total Joint Exercises Ankle Circles/Pumps: AROM;Both;20 reps;Supine Quad Sets: AROM;Left;10 reps;Supine Heel Slides: Left;10 reps;Supine;AAROM Hip ABduction/ADduction: AAROM;Left;10 reps;Supine Long Arc Quad: AROM;Left;10 reps;Seated    General Comments        Pertinent Vitals/Pain Pain Assessment: 0-10 Pain Score: 4  Pain Location: left hip Pain Descriptors / Indicators: Burning;Stabbing Pain Intervention(s): Monitored during session;Premedicated before session;Repositioned    Home Living                      Prior Function            PT Goals (current goals can now be found in the care plan section) Acute Rehab PT Goals Patient Stated Goal: to go home Progress towards PT goals: Progressing toward goals    Frequency    Min 5X/week      PT Plan Current  plan remains appropriate    Co-evaluation             End of Session Equipment Utilized During Treatment: Gait belt Activity Tolerance: Patient tolerated treatment well Patient left: in chair;with call bell/phone within reach     Time: 1522-1555 PT Time Calculation (min) (ACUTE ONLY): 33 min  Charges:  $Gait Training: 8-22 mins $Therapeutic Exercise: 8-22 mins $Therapeutic Activity: 8-22 mins                    G Codes:      Scheryl Marten PT, DPT   [phone]  08/09/2016, 3:59 PM

## 2016-08-09 NOTE — Progress Notes (Cosign Needed)
[facility] Progress Note  Subjective  Doing well Pain much better controlled Ready to go home   Smokes about 3-5 cigarettes a day, used to smoke 3.5 packs/day Understands nicotine increases her risk of complications  Review of Systems  Constitutional: Negative for chills and fever.  Respiratory: Negative for shortness of breath and wheezing.   Cardiovascular: Negative for chest pain and palpitations.  Gastrointestinal: Negative for nausea and vomiting.  Neurological: Negative for tingling and sensory change.  Psychiatric/Behavioral: Positive for memory loss.     Objective   BP (!) 101/46 (BP Location: Left Arm)   Pulse 88   Temp 99.1 F (37.3 C) (Oral)   Resp 18   Ht 5\' 4"  (1.626 m)   Wt 50.8 kg (112 lb)   SpO2 97%   BMI 19.22 kg/m   Intake/Output      12/30 0701 - 12/31 0700 12/31 0701 - 01/01 0700   P.O. 480    I.V. (mL/kg)     Total Intake(mL/kg) 480 (9.4)    Urine (mL/kg/hr) 400 (0.3)    Stool 0 (0)    Total Output 400     Net +80          Urine Occurrence 9 x    Stool Occurrence 2 x      Labs  No new labs   Intra-op cultures: NGTD, culture will be held for total of 5 days  Exam  Gen: resting comfortably in bed, NAD  Lungs: clear anterior fields Cardiac: RRR Abd: NT, +BS Ext:       Left Lower Extremity  Dressings c/d/i  Mild swelling Left leg  DPN, SPN, TN sensation intact  EHL, FHL, AT, PT, peroneals, gastroc motor intact  + Quad set  Ext warm   + DP pulse   No DCT     Assessment and Plan   POD/HD#: 83  65 y/o female with 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   - 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  WBAT  Posterior hip precautions  Dressing changes as needed  Ok to shower and clean wounds with soap and water  Ok to leave wound uncovered once wound is dry    Thigh high TED hose for swelling control   Ok to remove at night   Put on before getting out  of bed in the morning    Recommend outpatient DEXA scan by PCP   - Pain management:  Continue with current regimen   - Medical issues   Per medical service   - DVT/PE prophylaxis:  Lovenox    Recommend lovenox x 28 more days   - ID:   periop abx completed   No evidence of infection as source for nonunion   - Metabolic Bone Disease:  Needs outpt workup    Suspect poor bone quality   - Dispo:  Stable for dc today  Follow up with Dr. [name] in 7-10 days     Jari Pigg, PA-C [facility] [phone] (O) 08/09/2016 9:52 AM

## 2016-08-10 LAB — TYPE AND SCREEN
BLOOD PRODUCT EXPIRATION DATE: 201801102359
BLOOD PRODUCT EXPIRATION DATE: 201801102359
Blood Product Expiration Date: 201801122359
Blood Product Expiration Date: 201801122359
ISSUE DATE / TIME: 201712281550
ISSUE DATE / TIME: 201712281550
ISSUE DATE / TIME: 201712281951
UNIT TYPE AND RH: 5100
UNIT TYPE AND RH: 5100
Unit Type and Rh: 5100
Unit Type and Rh: 5100

## 2016-08-11 ENCOUNTER — Encounter (HOSPITAL_COMMUNITY): Payer: Self-pay | Admitting: Orthopedic Surgery

## 2016-08-11 LAB — AEROBIC/ANAEROBIC CULTURE W GRAM STAIN (SURGICAL/DEEP WOUND): Culture: NO GROWTH

## 2016-08-11 LAB — AEROBIC/ANAEROBIC CULTURE (SURGICAL/DEEP WOUND)

## 2016-08-11 NOTE — Care Management (Signed)
After speaking with patient's daughter. Nicole Soto, Case manager contacted [facility] in [place], made referral for [facility]. CM faxed order, H&P, demographics, F2F to [phone]. CM notified patient's daughter that start of care should be 08/12/16.[phone])

## 2016-08-11 NOTE — Discharge Summary (Signed)
Physician Discharge Summary  Nicole Soto [id number] DOB: 1951/04/25 DOA: 08/05/2016  PCP: Neale Burly, MD  Admit date: 08/05/2016 Discharge date: 08/09/2016  Time spent: 35 minutes  Recommendations for Outpatient Follow-up:  1. PCP in 1 week, started on lovenox 40mg  SQ daily for DVT prophylaxis for 3-4weeks STOP this after 4weeks 2. Ortho Dr.Murphy in 1 week   Discharge Diagnoses:  Principal Problem:   Hip fracture (Nicole Soto) Active Problems:   Essential hypertension   Generalized anxiety disorder   Malnutrition of moderate degree   Hypercoagulopathy ([place])   Difficulty in walking, not elsewhere classified   Discharge Condition: stable  Diet recommendation: heart healthy  Filed Weights   08/06/16 1456  Weight: 50.8 kg (112 lb)    History of present illness:  Nicole Soto a 66 y.o.femalewith medical history significant hypertension, anxiety, tobacco usewho presented with failure of hardware from previous hip fracture  Hospital Course:  Hip fracture ([place]) -admitted with Left hip non union and hardware fracture -Ortho consulted, s/p HARDWARE REMOVAL LEFT HIP ARTHROPLASTY BIPOLAR HIP (HEMIARTHROPLASTY) 12/28: required 3units PRBC for acute blood loss intraop. -PT consulted, doesn't want rehab, worked with PT and discharged home with HHPT -FU with Ortho Dr.Murphy -started on Lovenox 40mg  SQ daily for 4weeks for DVT prophylaxis  Acute blood loss anemia -s/p 3units PRBC -intraop, hb stable since    Essential hypertension -resumed HCTZ and benazepril    Generalized anxiety disorder -resumed xanax and paxil    Malnutrition of moderate degree -supplements advised    Hypercoagulopathy ([place]) -likely lab error, repeat normal  Discharge Exam: Vitals:   08/09/16 1500 08/09/16 1944  BP: (!) 94/42 (!) 107/51  Pulse: 94 93  Resp: 16 16  Temp: 99.1 F (37.3 C) 98.5 F (36.9 C)    General: AAOx3 Cardiovascular: S1S2/RRR Respiratory: CTAB  Discharge  Instructions   Discharge Instructions    Diet - low sodium heart healthy    Complete by:  As directed    Increase activity slowly    Complete by:  As directed    Posterior total hip precautions    Complete by:  As directed    Weight bearing as tolerated    Complete by:  As directed      Discharge Medication List as of 08/09/2016  4:26 PM    START taking these medications   Details  enoxaparin (LOVENOX) 40 MG/0.4ML injection Inject 0.4 mLs (40 mg total) into the skin daily. For 30 days post op for DVT prophylaxis, Starting Thu 08/06/2016, Until Sat 09/05/2016, Print    methocarbamol (ROBAXIN) 500 MG tablet Take 1 tablet (500 mg total) by mouth every 6 (six) hours as needed for muscle spasms., Starting Thu 08/06/2016, Print      CONTINUE these medications which have CHANGED   Details  HYDROcodone-acetaminophen (NORCO) 5-325 MG tablet Take 1-2 tablets by mouth every 6 (six) hours as needed for moderate pain., Starting Thu 08/06/2016, Print    polyethylene glycol (MIRALAX / GLYCOLAX) packet Take 17 g by mouth daily as needed for mild constipation., Starting Sun 08/09/2016, Print      CONTINUE these medications which have NOT CHANGED   Details  ALPRAZolam (XANAX) 0.5 MG tablet Take 1 tablet (0.5 mg total) by mouth 3 (three) times daily., Starting Fri 01/31/2016, Print    benazepril (LOTENSIN) 20 MG tablet Take 20 mg by mouth daily., Starting Mon 01/20/2016, Historical Med    feeding supplement, ENSURE ENLIVE, (ENSURE ENLIVE) LIQD Take 237 mLs by mouth 2 (two) times  daily between meals., Starting Fri 01/31/2016, No Print    hydrochlorothiazide (MICROZIDE) 12.5 MG capsule Take 12.5 mg by mouth daily., Starting Mon 01/20/2016, Historical Med    LYSINE PO Take 1 capsule by mouth daily., Historical Med    Multiple Vitamin (MULTIVITAMIN) tablet Take 1 tablet by mouth daily., Until Discontinued, Historical Med    OVER THE COUNTER MEDICATION Take 1 tablet by mouth daily. Equate Brand  Anti-Acid, Historical Med    PARoxetine (PAXIL) 10 MG tablet Take 10 mg by mouth daily., Starting Wed 06/24/2016, Historical Med    ondansetron (ZOFRAN) 4 MG tablet Take 1 tablet (4 mg total) by mouth every 8 (eight) hours as needed for nausea or vomiting., Starting Mon 01/27/2016, Print      STOP taking these medications     aspirin EC 325 MG tablet      oxyCODONE-acetaminophen (ROXICET) 5-325 MG tablet        Allergies  Allergen Reactions  . Penicillins Anaphylaxis and Other (See Comments)    Syncope  . Shrimp [Shellfish Allergy] Anaphylaxis  . Tape Rash and Other (See Comments)    M3 Tape   Follow-up Information    MURPHY, TIMOTHY D, MD. Schedule an appointment as soon as possible for a visit in 1 week(s).   Specialty:  Orthopedic Surgery Contact information: [address] [place] [address] [phone]            The results of significant diagnostics from this hospitalization (including imaging, microbiology, ancillary and laboratory) are listed below for reference.    Significant Diagnostic Studies: Chest Portable 1 View  Result Date: 08/05/2016 CLINICAL DATA:  Chest pain along the left side of the sternum status post fall EXAM: PORTABLE CHEST 1 VIEW COMPARISON:  None. FINDINGS: The heart size and mediastinal contours are within normal limits. Both lungs are clear. The visualized skeletal structures are unremarkable. IMPRESSION: No active disease. Electronically Signed   By: Kathreen Devoid   On: 08/05/2016 12:09   Dg Hip Port Unilat With Pelvis 1v Left  Result Date: 08/06/2016 CLINICAL DATA:  Left hip fracture. Status post left hip replacement. EXAM: DG HIP (WITH OR WITHOUT PELVIS) 1V PORT LEFT COMPARISON:  01/27/2016 FINDINGS: A left hip hemiarthroplasty with a long intramedullary stem, appears well-seated and well-aligned. There is no acute fracture or evidence of an operative complication. IMPRESSION: Well-positioned left hip  hemiarthroplasty. Electronically Signed   By: Lajean Manes M.D.   On: 08/06/2016 21:31    Microbiology: Recent Results (from the past 240 hour(s))  Surgical PCR screen     Status: None   Collection Time: 08/05/16  2:03 PM  Result Value Ref Range Status   MRSA, PCR NEGATIVE NEGATIVE Final   Staphylococcus aureus NEGATIVE NEGATIVE Final    Comment:        The Xpert SA Assay (FDA approved for NASAL specimens in patients over 18 years of age), is one component of a comprehensive surveillance program.  Test performance has been validated by [facility] for patients greater than or equal to 5 year old. It is not intended to diagnose infection nor to guide or monitor treatment.   Aerobic/Anaerobic Culture (surgical/deep wound)     Status: None (Preliminary result)   Collection Time: 08/06/16  5:29 PM  Result Value Ref Range Status   Specimen Description TISSUE LEFT HIP  Final   Special Requests PT ON VANC  Final   Gram Stain   Final    RARE WBC PRESENT,  PREDOMINANTLY PMN NO ORGANISMS SEEN    Culture   Final    NO GROWTH 2 DAYS NO ANAEROBES ISOLATED; CULTURE IN PROGRESS FOR 5 DAYS   Report Status PENDING  Incomplete     Labs: Basic Metabolic Panel:  Recent Labs Lab 08/05/16 1142 08/06/16 0427 08/06/16 1759 08/06/16 1945 08/07/16 2334  NA 139 138 141 139 136  K 3.7 3.8 3.6 4.1 3.6  CL 107 103  --   --  104  CO2 26 28  --   --  22  GLUCOSE 78 77 88 105* 134*  BUN 16 21*  --   --  17  CREATININE 0.52 0.60  --   --  0.73  CALCIUM 9.2 8.7*  --   --  8.2*   Liver Function Tests:  Recent Labs Lab 08/05/16 1142  AST 17  ALT 10*  ALKPHOS 94  BILITOT 0.4  PROT 6.7  ALBUMIN 3.4*   No results for input(s): LIPASE, AMYLASE in the last 168 hours. No results for input(s): AMMONIA in the last 168 hours. CBC:  Recent Labs Lab 08/05/16 1142 08/06/16 0427 08/06/16 1759 08/06/16 1945 08/06/16 2112 08/07/16 0821 08/08/16 0553  WBC 10.1 8.5  --   --  23.2* 12.2*  15.2*  NEUTROABS 6.1  --   --   --   --   --   --   HGB 10.9* 10.0* 9.5* 9.9* 11.2* 9.4* 8.4*  HCT 33.7* 31.3* 28.0* 29.0* 33.5* 28.2* 25.3*  MCV 101.8* 101.6*  --   --  95.7 94.6 95.1  PLT 495* 449*  --   --  300 276 255   Cardiac Enzymes: No results for input(s): CKTOTAL, CKMB, CKMBINDEX, TROPONINI in the last 168 hours. BNP: BNP (last 3 results) No results for input(s): BNP in the last 8760 hours.  ProBNP (last 3 results) No results for input(s): PROBNP in the last 8760 hours.  CBG: No results for input(s): GLUCAP in the last 168 hours.     SignedDomenic Polite MD.  [facility] 08/11/2016, 2:38 PM

## 2017-01-14 IMAGING — RF DG C-ARM 61-120 MIN
1 series · 4 of 4 positions shown · non-contrast
Comparison: [DATE]

CLINICAL DATA: Imaging obtained during ORIF of a left
intertrochanteric proximal femur fracture.

EXAM:
DG C-ARM 61-120 MIN; LEFT FEMUR 2 VIEWS

[Series 1: run · 4 of 4 slices shown]
[im 1/4]
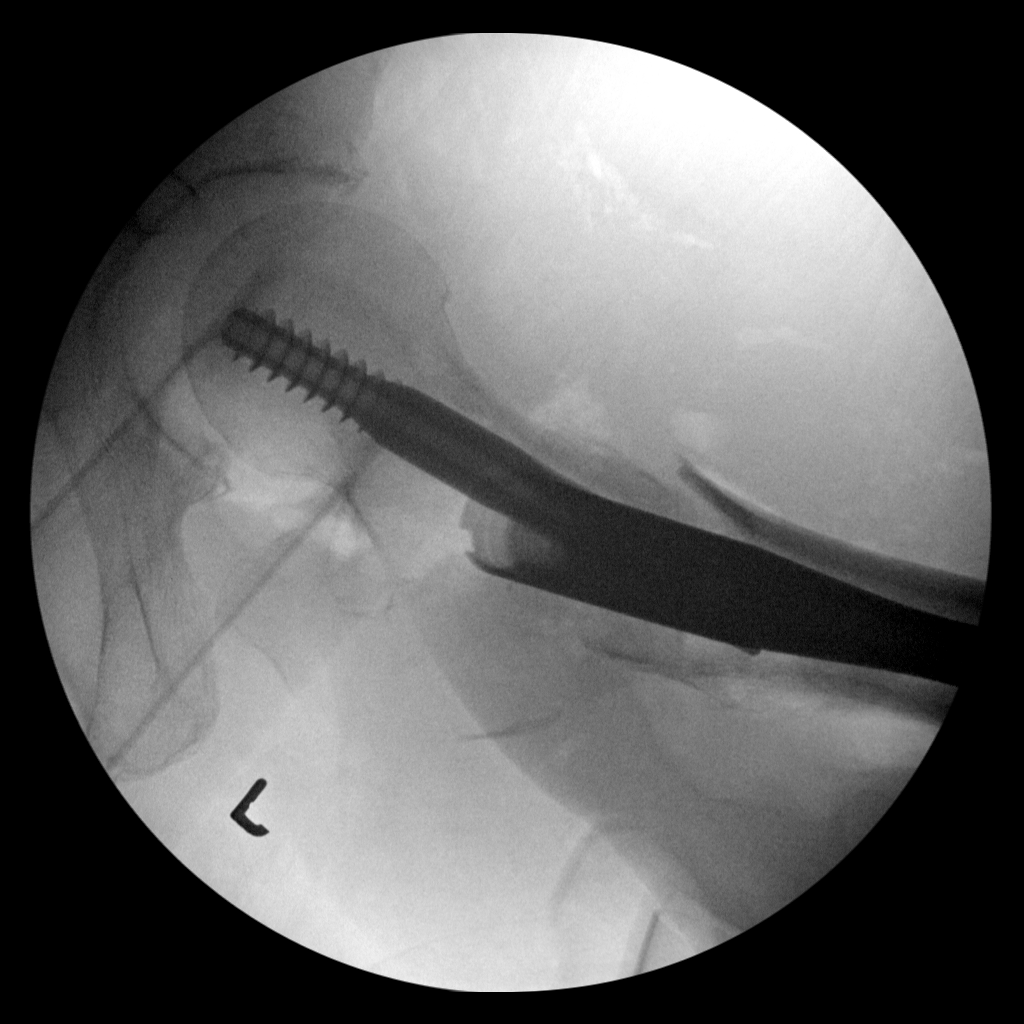
[im 2/4]
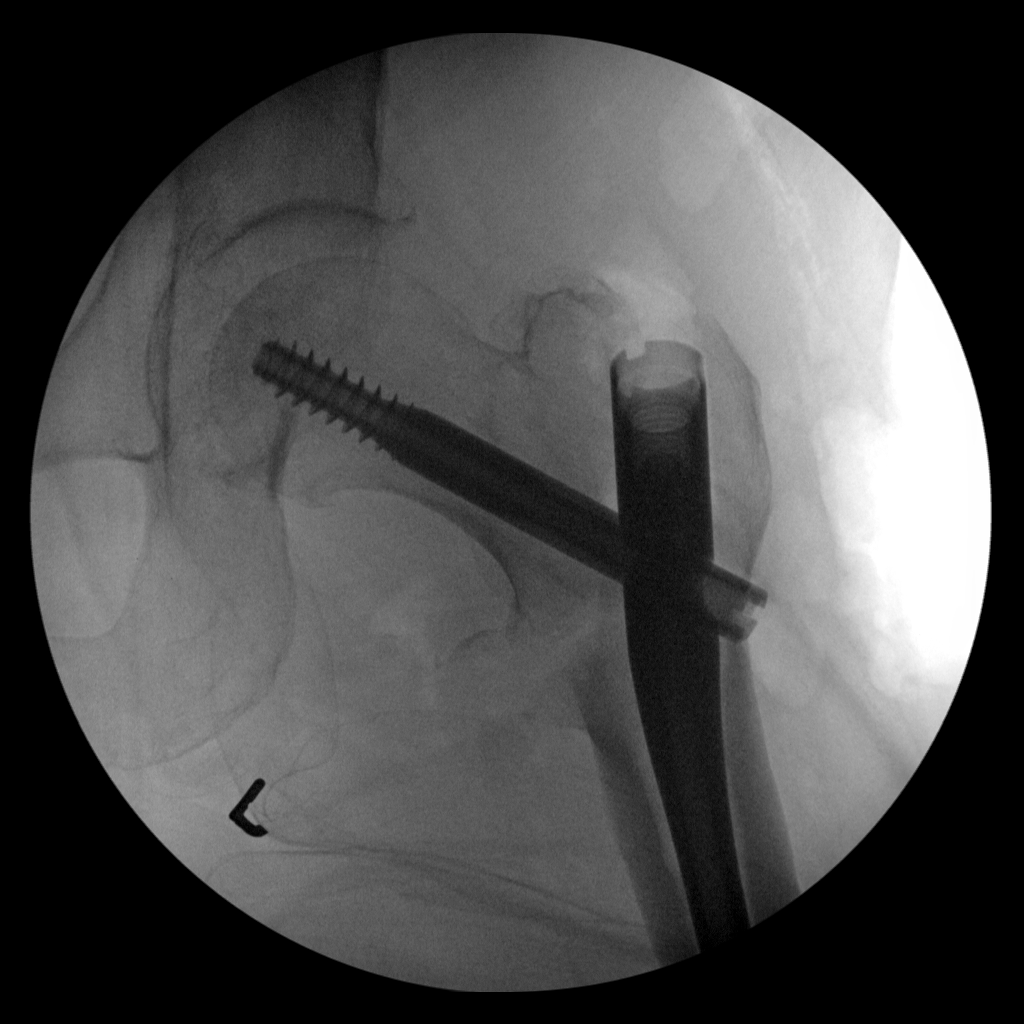
[im 3/4]
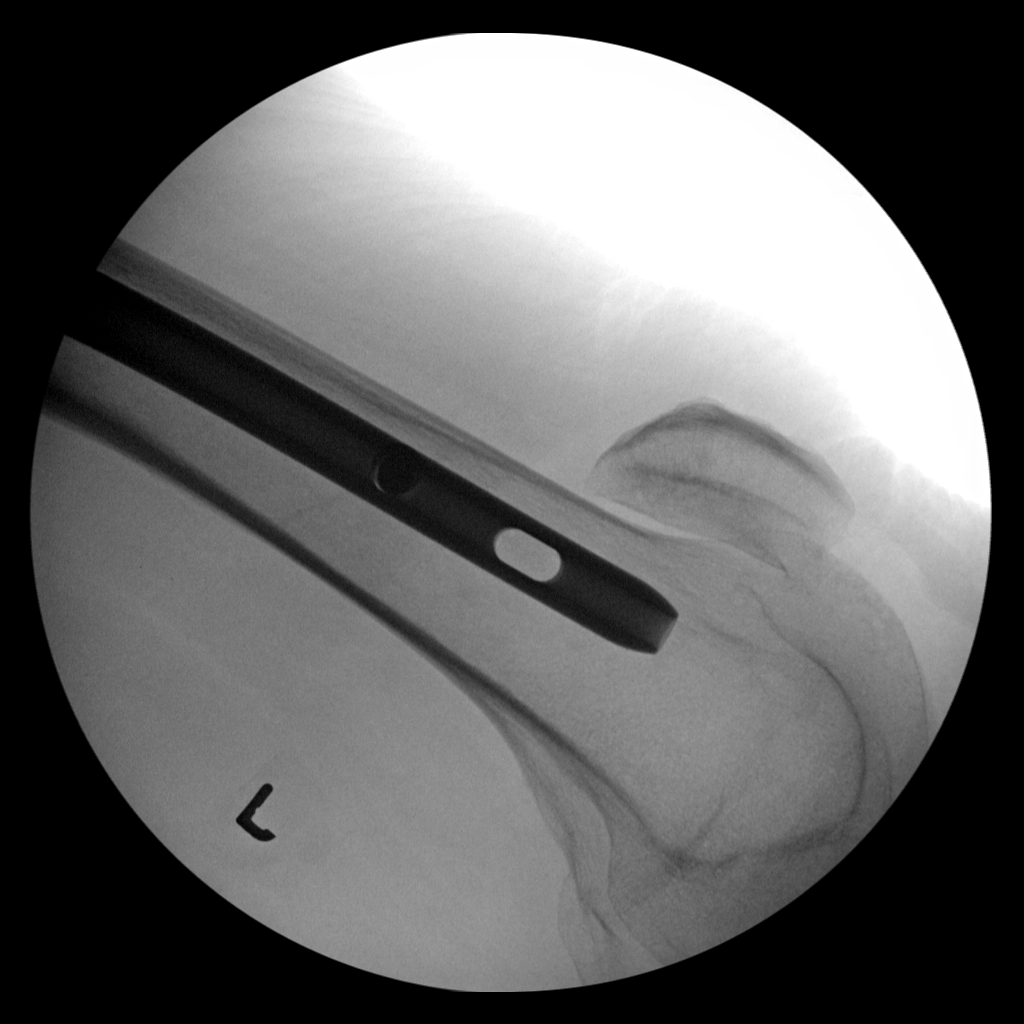
[im 4/4]
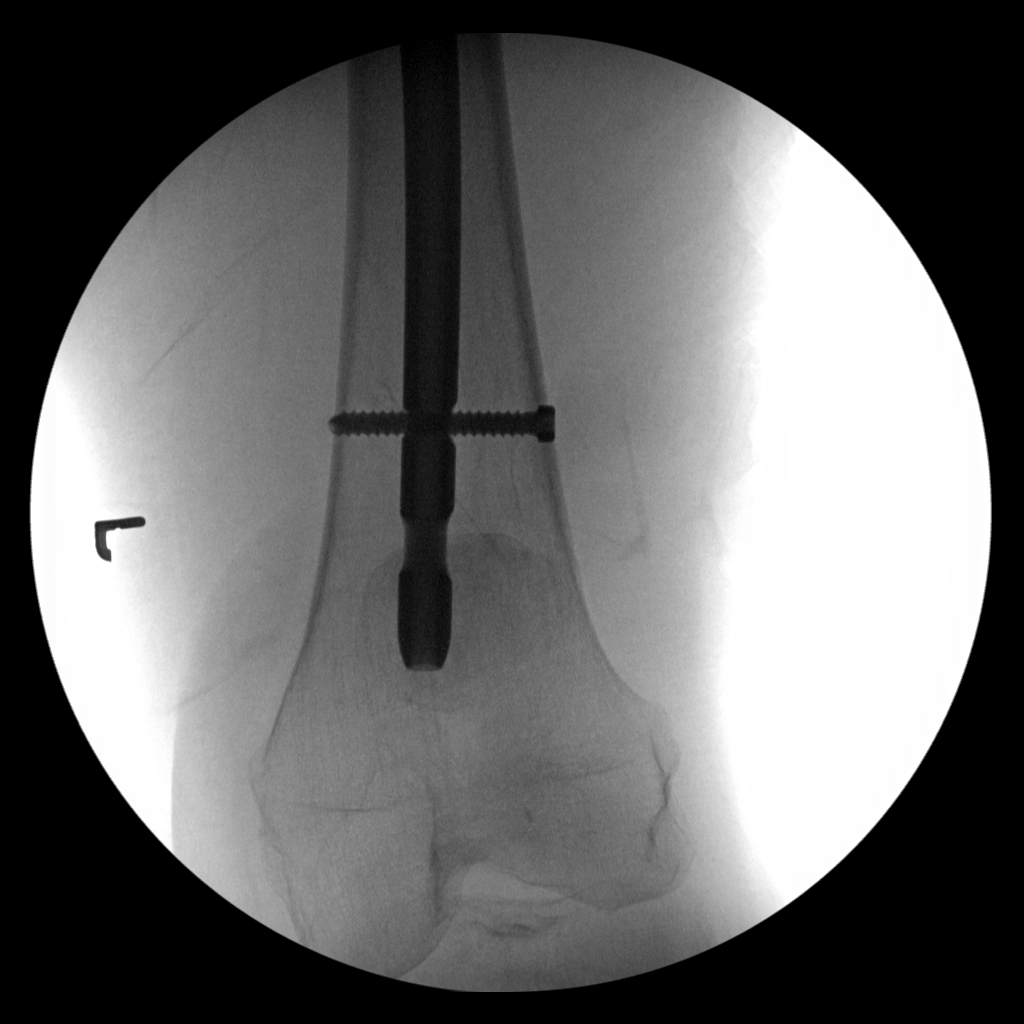

[4 of 4 positions shown; findings below may reference images not displayed]

FINDINGS: For submitted images show placement of an intra medullary rod and a
compression screw reducing the major intertrochanteric fracture
components into near anatomic alignment.

There is no new fracture or evidence of an operative complication.
The orthopedic hardware appears well seated.
IMPRESSION: Well aligned major fracture components following ORIF of a left
proximal femur intertrochanteric fracture.

## 2017-07-24 IMAGING — CR DG CHEST 1V PORT
1 series · 1 of 1 positions shown · non-contrast
Comparison: None.

CLINICAL DATA: Chest pain along the left side of the sternum status
post fall

EXAM:
PORTABLE CHEST 1 VIEW

[AP]
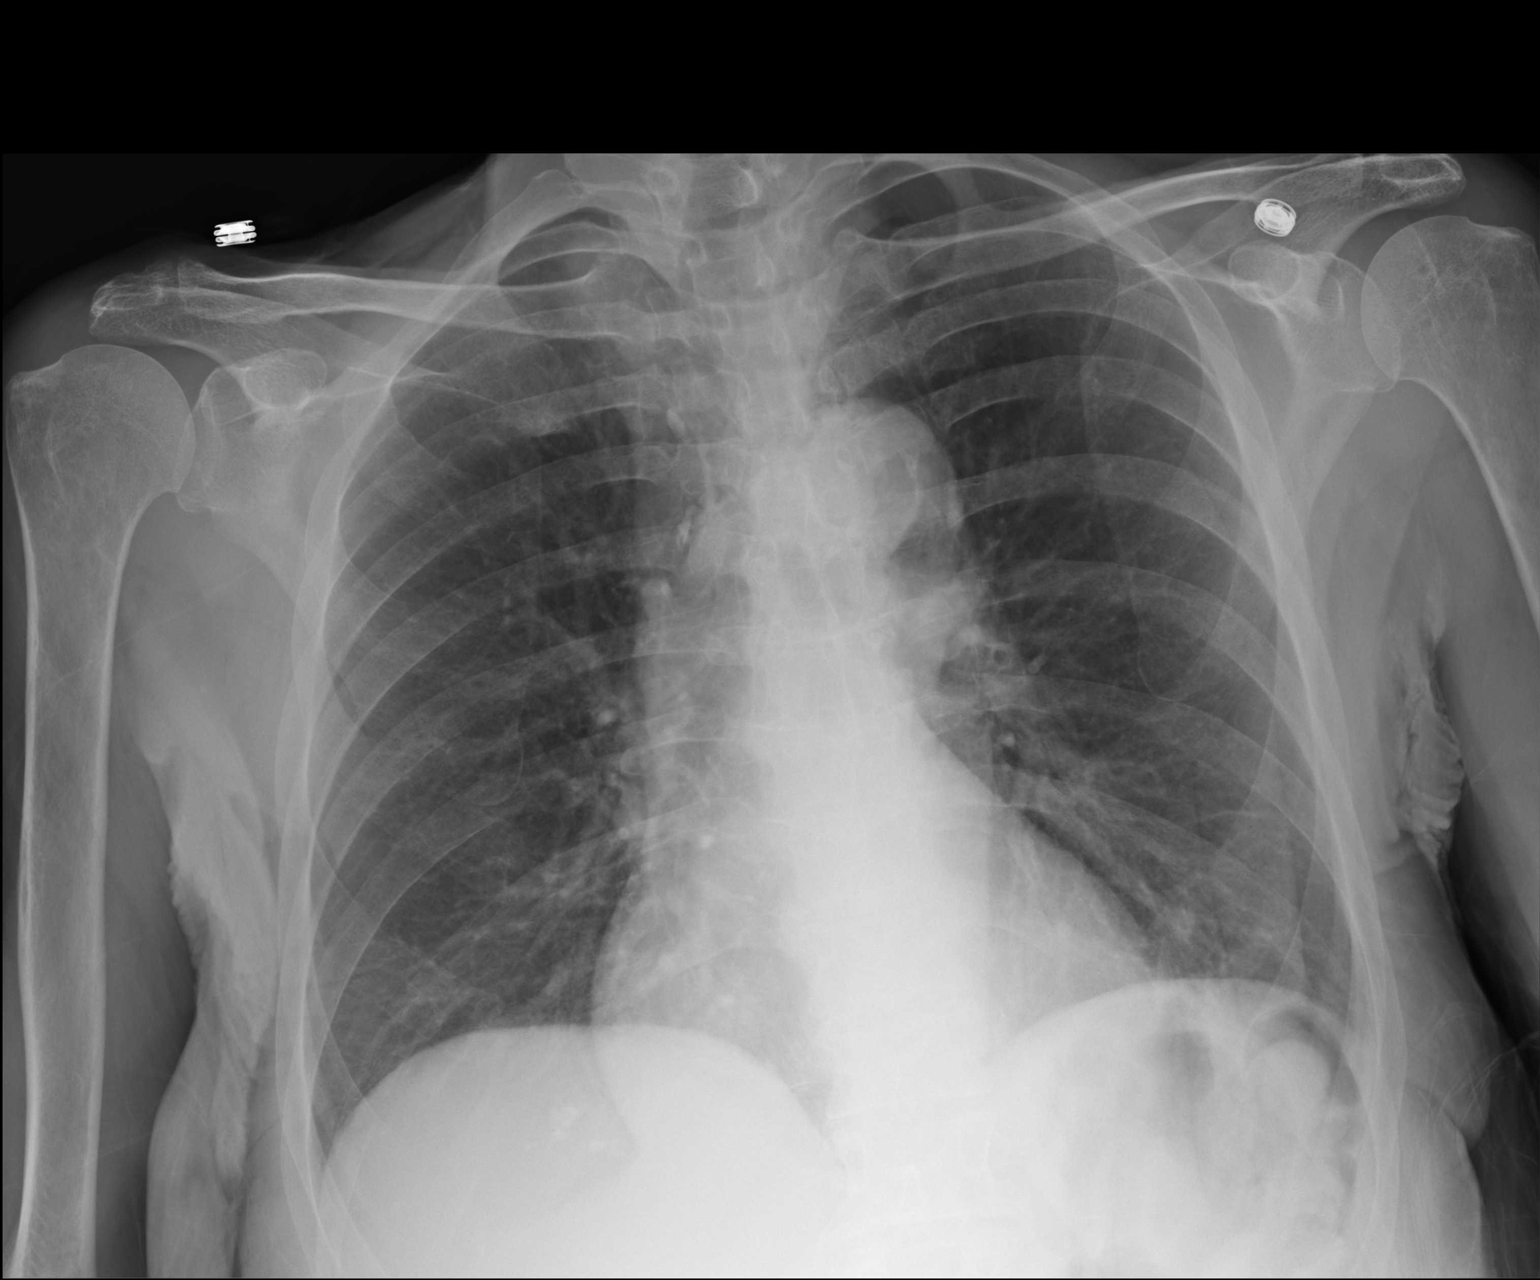

[1 of 1 positions shown; findings below may reference images not displayed]

FINDINGS: The heart size and mediastinal contours are within normal limits.
Both lungs are clear. The visualized skeletal structures are
unremarkable.
IMPRESSION: No active disease.

## 2021-07-10 DEATH — deceased
